# Patient Record
Sex: Female | Born: 1945
Health system: Southern US, Community
[De-identification: ages and names within clinical notes are randomized; demographics above are authoritative.]

## PROBLEM LIST (undated history)

## (undated) DIAGNOSIS — K219 Gastro-esophageal reflux disease without esophagitis: Secondary | ICD-10-CM

## (undated) DIAGNOSIS — Z8619 Personal history of other infectious and parasitic diseases: Secondary | ICD-10-CM

## (undated) DIAGNOSIS — E7800 Pure hypercholesterolemia, unspecified: Secondary | ICD-10-CM

## (undated) DIAGNOSIS — G43909 Migraine, unspecified, not intractable, without status migrainosus: Secondary | ICD-10-CM

## (undated) DIAGNOSIS — I10 Essential (primary) hypertension: Secondary | ICD-10-CM

## (undated) DIAGNOSIS — I700 Atherosclerosis of aorta: Secondary | ICD-10-CM

## (undated) DIAGNOSIS — Z8719 Personal history of other diseases of the digestive system: Secondary | ICD-10-CM

## (undated) HISTORY — PX: WRIST SURGERY: SHX841

## (undated) HISTORY — PX: ABDOMINAL HYSTERECTOMY: SHX81

## (undated) HISTORY — DX: Personal history of other infectious and parasitic diseases: Z86.19

## (undated) HISTORY — DX: Atherosclerosis of aorta: I70.0

## (undated) HISTORY — DX: Personal history of other diseases of the digestive system: Z87.19

## (undated) HISTORY — PX: TUBAL LIGATION: SHX77

---
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
## 2016-01-06 DIAGNOSIS — E782 Mixed hyperlipidemia: Secondary | ICD-10-CM

## 2016-01-06 DIAGNOSIS — G43909 Migraine, unspecified, not intractable, without status migrainosus: Secondary | ICD-10-CM | POA: Insufficient documentation

## 2016-01-06 DIAGNOSIS — K219 Gastro-esophageal reflux disease without esophagitis: Secondary | ICD-10-CM | POA: Insufficient documentation

## 2016-01-06 DIAGNOSIS — M810 Age-related osteoporosis without current pathological fracture: Secondary | ICD-10-CM | POA: Insufficient documentation

## 2016-01-06 DIAGNOSIS — H5213 Myopia, bilateral: Secondary | ICD-10-CM

## 2016-01-06 DIAGNOSIS — H524 Presbyopia: Secondary | ICD-10-CM

## 2016-01-06 DIAGNOSIS — Z79899 Other long term (current) drug therapy: Secondary | ICD-10-CM

## 2016-01-06 DIAGNOSIS — H2513 Age-related nuclear cataract, bilateral: Secondary | ICD-10-CM

## 2016-01-06 DIAGNOSIS — M722 Plantar fascial fibromatosis: Secondary | ICD-10-CM | POA: Insufficient documentation

## 2016-01-06 DIAGNOSIS — E1169 Type 2 diabetes mellitus with other specified complication: Secondary | ICD-10-CM | POA: Insufficient documentation

## 2016-01-06 HISTORY — DX: Myopia, bilateral: H52.13

## 2016-01-06 HISTORY — DX: Age-related osteoporosis without current pathological fracture: M81.0

## 2016-01-06 HISTORY — DX: Mixed hyperlipidemia: E78.2

## 2016-01-06 HISTORY — DX: Gastro-esophageal reflux disease without esophagitis: K21.9

## 2016-01-06 HISTORY — DX: Age-related nuclear cataract, bilateral: H25.13

## 2016-01-06 HISTORY — DX: Migraine, unspecified, not intractable, without status migrainosus: G43.909

## 2016-01-06 HISTORY — DX: Plantar fascial fibromatosis: M72.2

## 2016-01-06 HISTORY — DX: Presbyopia: H52.4

## 2016-01-06 HISTORY — DX: Other long term (current) drug therapy: Z79.899

## 2016-05-23 DIAGNOSIS — N816 Rectocele: Secondary | ICD-10-CM | POA: Insufficient documentation

## 2016-05-23 HISTORY — DX: Rectocele: N81.6

## 2017-07-23 DIAGNOSIS — E782 Mixed hyperlipidemia: Secondary | ICD-10-CM | POA: Diagnosis not present

## 2017-07-25 DIAGNOSIS — M25551 Pain in right hip: Secondary | ICD-10-CM | POA: Diagnosis not present

## 2017-07-25 DIAGNOSIS — Z23 Encounter for immunization: Secondary | ICD-10-CM | POA: Diagnosis not present

## 2017-07-25 DIAGNOSIS — Z0000 Encounter for general adult medical examination without abnormal findings: Secondary | ICD-10-CM | POA: Diagnosis not present

## 2017-07-25 DIAGNOSIS — K219 Gastro-esophageal reflux disease without esophagitis: Secondary | ICD-10-CM | POA: Diagnosis not present

## 2017-07-25 DIAGNOSIS — R8290 Unspecified abnormal findings in urine: Secondary | ICD-10-CM | POA: Diagnosis not present

## 2017-07-25 DIAGNOSIS — G43009 Migraine without aura, not intractable, without status migrainosus: Secondary | ICD-10-CM | POA: Diagnosis not present

## 2017-07-31 DIAGNOSIS — Z1212 Encounter for screening for malignant neoplasm of rectum: Secondary | ICD-10-CM | POA: Diagnosis not present

## 2017-08-01 DIAGNOSIS — M7061 Trochanteric bursitis, right hip: Secondary | ICD-10-CM | POA: Diagnosis not present

## 2017-08-08 DIAGNOSIS — K921 Melena: Secondary | ICD-10-CM | POA: Diagnosis not present

## 2017-08-27 DIAGNOSIS — Z1211 Encounter for screening for malignant neoplasm of colon: Secondary | ICD-10-CM | POA: Diagnosis not present

## 2017-08-27 DIAGNOSIS — K5730 Diverticulosis of large intestine without perforation or abscess without bleeding: Secondary | ICD-10-CM | POA: Diagnosis not present

## 2017-08-27 DIAGNOSIS — Z86010 Personal history of colonic polyps: Secondary | ICD-10-CM | POA: Diagnosis not present

## 2017-08-27 DIAGNOSIS — K648 Other hemorrhoids: Secondary | ICD-10-CM | POA: Diagnosis not present

## 2017-08-27 DIAGNOSIS — K649 Unspecified hemorrhoids: Secondary | ICD-10-CM | POA: Diagnosis not present

## 2017-09-17 DIAGNOSIS — H524 Presbyopia: Secondary | ICD-10-CM | POA: Diagnosis not present

## 2017-09-17 DIAGNOSIS — H11153 Pinguecula, bilateral: Secondary | ICD-10-CM | POA: Diagnosis not present

## 2017-09-17 DIAGNOSIS — H52203 Unspecified astigmatism, bilateral: Secondary | ICD-10-CM | POA: Diagnosis not present

## 2017-09-17 DIAGNOSIS — H2513 Age-related nuclear cataract, bilateral: Secondary | ICD-10-CM | POA: Diagnosis not present

## 2017-11-08 ENCOUNTER — Emergency Department (HOSPITAL_COMMUNITY): Payer: Medicare Other

## 2017-11-08 ENCOUNTER — Other Ambulatory Visit: Payer: Self-pay

## 2017-11-08 ENCOUNTER — Encounter (HOSPITAL_COMMUNITY): Payer: Self-pay | Admitting: *Deleted

## 2017-11-08 ENCOUNTER — Emergency Department (HOSPITAL_COMMUNITY)
Admission: EM | Admit: 2017-11-08 | Discharge: 2017-11-08 | Disposition: A | Discharge status: Home / Self Care | Payer: Medicare Other | Attending: Emergency Medicine | Admitting: Emergency Medicine

## 2017-11-08 DIAGNOSIS — H6121 Impacted cerumen, right ear: Secondary | ICD-10-CM | POA: Diagnosis not present

## 2017-11-08 DIAGNOSIS — M542 Cervicalgia: Secondary | ICD-10-CM | POA: Insufficient documentation

## 2017-11-08 DIAGNOSIS — R519 Headache, unspecified: Secondary | ICD-10-CM

## 2017-11-08 DIAGNOSIS — R42 Dizziness and giddiness: Secondary | ICD-10-CM

## 2017-11-08 DIAGNOSIS — H6982 Other specified disorders of Eustachian tube, left ear: Secondary | ICD-10-CM | POA: Diagnosis not present

## 2017-11-08 DIAGNOSIS — E876 Hypokalemia: Secondary | ICD-10-CM

## 2017-11-08 DIAGNOSIS — R51 Headache: Secondary | ICD-10-CM | POA: Diagnosis not present

## 2017-11-08 DIAGNOSIS — I10 Essential (primary) hypertension: Secondary | ICD-10-CM | POA: Diagnosis not present

## 2017-11-08 DIAGNOSIS — Z87891 Personal history of nicotine dependence: Secondary | ICD-10-CM | POA: Diagnosis not present

## 2017-11-08 HISTORY — DX: Migraine, unspecified, not intractable, without status migrainosus: G43.909

## 2017-11-08 HISTORY — DX: Pure hypercholesterolemia, unspecified: E78.00

## 2017-11-08 HISTORY — DX: Gastro-esophageal reflux disease without esophagitis: K21.9

## 2017-11-08 LAB — DIFFERENTIAL
Basophils Absolute: 0 10*3/uL (ref 0.0–0.1)
Basophils Relative: 0 %
Eosinophils Absolute: 0.1 10*3/uL (ref 0.0–0.7)
Eosinophils Relative: 1 %
LYMPHS ABS: 1.4 10*3/uL (ref 0.7–4.0)
LYMPHS PCT: 19 %
MONOS PCT: 6 %
Monocytes Absolute: 0.5 10*3/uL (ref 0.1–1.0)
NEUTROS ABS: 5.2 10*3/uL (ref 1.7–7.7)
Neutrophils Relative %: 74 %

## 2017-11-08 LAB — CBC
HEMATOCRIT: 44.4 % (ref 36.0–46.0)
Hemoglobin: 15.2 g/dL — ABNORMAL HIGH (ref 12.0–15.0)
MCH: 30.6 pg (ref 26.0–34.0)
MCHC: 34.2 g/dL (ref 30.0–36.0)
MCV: 89.3 fL (ref 78.0–100.0)
Platelets: 223 10*3/uL (ref 150–400)
RBC: 4.97 MIL/uL (ref 3.87–5.11)
RDW: 13 % (ref 11.5–15.5)
WBC: 7.2 10*3/uL (ref 4.0–10.5)

## 2017-11-08 LAB — COMPREHENSIVE METABOLIC PANEL
ALK PHOS: 47 U/L (ref 38–126)
ALT: 34 U/L (ref 14–54)
AST: 37 U/L (ref 15–41)
Albumin: 4.4 g/dL (ref 3.5–5.0)
Anion gap: 14 (ref 5–15)
BUN: 19 mg/dL (ref 6–20)
CALCIUM: 10.3 mg/dL (ref 8.9–10.3)
CO2: 21 mmol/L — ABNORMAL LOW (ref 22–32)
CREATININE: 0.91 mg/dL (ref 0.44–1.00)
Chloride: 105 mmol/L (ref 101–111)
Glucose, Bld: 101 mg/dL — ABNORMAL HIGH (ref 65–99)
Potassium: 3.3 mmol/L — ABNORMAL LOW (ref 3.5–5.1)
Sodium: 140 mmol/L (ref 135–145)
Total Bilirubin: 1.8 mg/dL — ABNORMAL HIGH (ref 0.3–1.2)
Total Protein: 6.6 g/dL (ref 6.5–8.1)

## 2017-11-08 LAB — I-STAT TROPONIN, ED
TROPONIN I, POC: 0 ng/mL (ref 0.00–0.08)
Troponin i, poc: 0.01 ng/mL (ref 0.00–0.08)

## 2017-11-08 LAB — PROTIME-INR
INR: 0.95
Prothrombin Time: 12.6 seconds (ref 11.4–15.2)

## 2017-11-08 LAB — APTT: aPTT: 27 seconds (ref 24–36)

## 2017-11-08 MED ORDER — MECLIZINE HCL 25 MG PO TABS: 25.0000 mg | ORAL_TABLET | Freq: Three times a day (TID) | ORAL | 0 refills | Status: DC | PRN

## 2017-11-08 MED ORDER — FLUTICASONE PROPIONATE 50 MCG/ACT NA SUSP: 2.0000 | Freq: Every day | NASAL | 0 refills | Status: DC

## 2017-11-08 MED ORDER — POTASSIUM CHLORIDE CRYS ER 20 MEQ PO TBCR
40.0000 meq | EXTENDED_RELEASE_TABLET | Freq: Once | ORAL | Status: AC
  Administered 2017-11-08: 40 meq via ORAL
  Filled 2017-11-08: qty 2

## 2017-11-08 MED ORDER — MECLIZINE HCL 25 MG PO TABS
25.0000 mg | ORAL_TABLET | Freq: Once | ORAL | Status: AC
  Administered 2017-11-08: 25 mg via ORAL
  Filled 2017-11-08: qty 1

## 2017-11-08 NOTE — ED Triage Notes (Signed)
Pt sent here from [facility], having dizziness x 3 days. States it feels like the room is spinning, occurs when she moves her head and when lying still. Has mild squeezing pain to top of her head and intermittent pain to her neck.

## 2017-11-08 NOTE — Discharge Instructions (Signed)
Your symptoms could be due to multiple causes, however today's work up has been reassuring. Use meclizine as directed as needed for dizziness. Stay well hydrated. Use flonase and netipot nasal saline rinse as directed for sinus congestion and to help with your ear issue. Use over the counter antihistamines such as claritin/zyrtec/allegra/etc to help with the symptoms as well. Alternate between tylenol and ibuprofen as needed for pain.   Your blood pressure was elevated today. Eat a low salt/low sodium diet. Keep a log of your blood pressure readings from every morning and evening (making sure to give yourself at least 15 minutes of rest prior to checking it) and take it to your doctor's office at your next appointment for ongoing management of your blood pressure. Stay well hydrated and get plenty of rest. Avoid caffeine and other over the counter products that would make your blood pressure go up (such as decongestants, excedrin, etc).   Follow up with your regular doctor in 5-7 days for recheck of symptoms and ongoing management of your blood pressure. Return to the ER for emergent changes or worsening symptoms.

## 2017-11-08 NOTE — ED Notes (Signed)
PT states understanding of care given, follow up care, and medication prescribed. PT ambulated from ED to car with a steady gait. 

## 2017-11-08 NOTE — ED Provider Notes (Signed)
10:37 PM BP (!) 167/101   Pulse 73   Temp 98.4 F (36.9 C) (Oral)   Resp 13   Ht 5\' 6"  (1.676 m)   Wt 102.1 kg (225 lb)   SpO2 96%   BMI 36.32 kg/m  Patient with 3 days of Dizziness, MRI pending. Expect d/c if neg. Ambulatory.  11:27 PM BP (!) 150/83 (BP Location: Right Arm)   Pulse 65   Temp 98.4 F (36.9 C) (Oral)   Resp 18   Ht 5\' 6"  (1.676 m)   Wt 102.1 kg (225 lb)   SpO2 98%   BMI 36.32 kg/m  Patient MRI negative for Stroke or other abnormality. The patient will be discharged. She is ambulatory in the ER.       Arthor CaptainHarris, Aedon Deason, PA-C 11/08/17 2328    Rolan BuccoBelfi, Melanie, MD 11/08/17 [phone]

## 2017-11-08 NOTE — ED Provider Notes (Signed)
[facility] EMERGENCY DEPARTMENT Provider Note   CSN: [id number] Arrival date & time: 11/08/17  1428     History   Chief Complaint Chief Complaint  Patient presents with  . Dizziness    HPI Victoria Barajas is a 72 y.o. female with a PMHx of HLD, GERD, migraines, and osteoporosis, who presents to the ED with complaints of dizziness x 3 days.  Patient states that she has intermittent dizzy spells whenever she moves her head, it feels as though she is spinning or falling.  The symptoms only occur with head movement, and seem to improve with being still.  She has never had anything like this before.  She states that 4 days ago, the day prior to onset of her symptoms, she had a migraine which resolved with her usual Excedrin use.  However today she states that when she was on her way to the urgent care around 8am, she started having a squeezing sensation on the crown of her head that she states is not a headache but just a pressure in the crown of her head.  She describes this as a 5/10 constant pressure-like sensation at the top of her head, nonradiating, worse with laying her head back, and with no treatments tried prior to arrival.  She states this feels different than migraines, and does not feel like a headache.  She also mentions that for the last 2 months she will have a sensation of the right neck/skull base as though she is going to get a "crick in her neck"and she will turn her neck and hear a pop in the sensation resolves.  Lastly she mentions that she has a chronic ear popping/crackling intermittently in the left ear, but states this is unchanged.  She went to the [facility] urgent care this morning and was referred here for further evaluation of her symptoms.  Of note, she states she's had high blood pressure readings recently at her PCPs visits, but hasn't been started on meds yet, however thinks she's going to be started on HTN meds at her next PCP visit, since it was brought  up recently.  She denies any changes in her meds recently, no new meds recently, takes pravachol and omeprazole.  Her PCP is Dr. Daneil Dolin in [facility].   She denies any vision changes, ear pain or drainage, tinnitus, rhinorrhea, sinus congestion, URI symptoms, cough, fevers, chills, chest pain, shortness of breath, abdominal pain, nausea, vomiting, diarrhea, constipation, melena, hematochezia, dysuria, hematuria, myalgias, arthralgias, neck pain, numbness, tingling, focal weakness, syncope, or any other complaints at this time.   The history is provided by the patient and medical records. No language interpreter was used.  Dizziness  Quality:  Vertigo Severity:  Mild Onset quality:  Gradual Duration:  3 days Timing:  Intermittent Progression:  Waxing and waning Chronicity:  New Context: head movement   Relieved by:  Being still Worsened by:  Turning head and movement Ineffective treatments:  None tried Associated symptoms: headaches   Associated symptoms: no blood in stool, no chest pain, no diarrhea, no nausea, no shortness of breath, no syncope, no tinnitus, no vision changes, no vomiting and no weakness   Risk factors: no multiple medications and no new medications     Past Medical History:  Diagnosis Date  . Acid reflux   . High cholesterol   . Migraine     There are no active problems to display for this patient.   History reviewed. No pertinent  surgical history.  OB History    No data available       Home Medications    Prior to Admission medications   Not on File    Family History History reviewed. No pertinent family history.  Social History Social History   Tobacco Use  . Smoking status: Former Smoker  Substance Use Topics  . Alcohol use: No    Frequency: Never  . Drug use: No     Allergies   Patient has no known allergies.   Review of Systems Review of Systems  Constitutional: Negative for chills and fever.  HENT: Negative for ear  discharge, ear pain, rhinorrhea, sore throat and tinnitus.   Eyes: Negative for visual disturbance.  Respiratory: Negative for cough and shortness of breath.   Cardiovascular: Negative for chest pain and syncope.  Gastrointestinal: Negative for abdominal pain, blood in stool, constipation, diarrhea, nausea and vomiting.  Genitourinary: Negative for dysuria and hematuria.  Musculoskeletal: Negative for arthralgias, myalgias and neck pain.  Skin: Negative for color change.  Allergic/Immunologic: Negative for immunocompromised state.  Neurological: Positive for dizziness and headaches. Negative for syncope, weakness and numbness.  Psychiatric/Behavioral: Negative for confusion.   All other systems reviewed and are negative for acute change except as noted in the HPI.    Physical Exam Updated Vital Signs BP (!) 128/114   Pulse 87   Temp 98.4 F (36.9 C) (Oral)   Resp 17   Ht 5\' 6"  (1.676 m)   Wt 102.1 kg (225 lb)   SpO2 99%   BMI 36.32 kg/m   Physical Exam  Constitutional: She is oriented to person, place, and time. Vital signs are normal. She appears well-developed and well-nourished.  Non-toxic appearance. No distress.  Afebrile, nontoxic, NAD, BP 173/89 during evaluation  HENT:  Head: Normocephalic and atraumatic.  Right Ear: Hearing and external ear normal. A foreign body (cerumen impaction) is present.  Left Ear: Hearing, external ear and ear canal normal. Tympanic membrane is not injected, not erythematous and not bulging. A middle ear effusion (serous) is present.  Nose: Nose normal.  Mouth/Throat: Uvula is midline, oropharynx is clear and moist and mucous membranes are normal. No trismus in the jaw. No uvula swelling.  R ear occluded with cerumen, unable to visualize TM. L ear with clear canal, mild serous effusion but no TM erythema or bulging. Nose clear. Oropharynx clear and moist, without uvular swelling or deviation, no trismus or drooling, no tonsillar swelling or  erythema, no exudates.    Eyes: Conjunctivae and EOM are normal. Pupils are equal, round, and reactive to light. Right eye exhibits no discharge. Left eye exhibits no discharge.  PERRL, EOMI, no nystagmus  Neck: Normal range of motion. Neck supple. No spinous process tenderness and no muscular tenderness present. No neck rigidity. Normal range of motion present.  FROM intact without spinous process TTP, no bony stepoffs or deformities, no paraspinous muscle TTP or muscle spasms. No rigidity or meningeal signs. No bruising or swelling.   Cardiovascular: Normal rate, regular rhythm, normal heart sounds and intact distal pulses. Exam reveals no gallop and no friction rub.  No murmur heard. Pulmonary/Chest: Effort normal and breath sounds normal. No respiratory distress. She has no decreased breath sounds. She has no wheezes. She has no rhonchi. She has no rales.  Abdominal: Soft. Normal appearance and bowel sounds are normal. She exhibits no distension. There is no tenderness. There is no rigidity, no rebound, no guarding, no CVA tenderness, no tenderness at  McBurney's point and negative Murphy's sign.  Musculoskeletal: Normal range of motion.  MAE x4 Strength and sensation grossly intact in all extremities Distal pulses intact Gait steady  Neurological: She is alert and oriented to person, place, and time. She has normal strength. No cranial nerve deficit or sensory deficit. Coordination and gait normal. GCS eye subscore is 4. GCS verbal subscore is 5. GCS motor subscore is 6.  CN 2-12 grossly intact A&O x4 GCS 15 Sensation and strength intact Gait nonataxic, mild difficulty with tandem walking but able to complete tandem steps without assistance, moving somewhat slowly Coordination with finger-to-nose WNL Neg pronator drift   Skin: Skin is warm, dry and intact. No rash noted.  Psychiatric: She has a normal mood and affect.  Nursing note and vitals reviewed.    ED Treatments / Results    Labs (all labs ordered are listed, but only abnormal results are displayed) Labs Reviewed  CBC - Abnormal; Notable for the following components:      Result Value   Hemoglobin 15.2 (*)    All other components within normal limits  COMPREHENSIVE METABOLIC PANEL - Abnormal; Notable for the following components:   Potassium 3.3 (*)    CO2 21 (*)    Glucose, Bld 101 (*)    Total Bilirubin 1.8 (*)    All other components within normal limits  PROTIME-INR  APTT  DIFFERENTIAL  I-STAT TROPONIN, ED  I-STAT TROPONIN, ED    EKG  EKG Interpretation  Date/Time:  Friday November 08 2017 14:44:49 EST Ventricular Rate:  88 PR Interval:  146 QRS Duration: 150 QT Interval:  404 QTC Calculation: 488 R Axis:   -22 Text Interpretation:  Sinus rhythm with occasional Premature ventricular complexes and Possible Premature atrial complexes with Abberant conduction Left bundle branch block Abnormal ECG Confirmed by Blane Ohara [phone]) on 11/08/2017 7:20:23 PM       Radiology Ct Head Wo Contrast  Result Date: 11/08/2017 CLINICAL DATA:  Dizziness and syncopal episodes for the past week. EXAM: CT HEAD WITHOUT CONTRAST TECHNIQUE: Contiguous axial images were obtained from the base of the skull through the vertex without intravenous contrast. COMPARISON:  None. FINDINGS: Brain: Normal appearing cerebral hemispheres and posterior fossa structures. Normal size and position of the ventricles. No intracranial hemorrhage, mass lesion or CT evidence of acute infarction. Vascular: No hyperdense vessel or unexpected calcification. Skull: Normal. Negative for fracture or focal lesion. Sinuses/Orbits: Mild right frontal and minimal right ethmoid sinus mucosal thickening. Unremarkable orbits. Other: None. IMPRESSION: 1. No intracranial abnormality. 2. Mild chronic sinusitis. Electronically Signed   By: Beckie Salts M.D.   On: 11/08/2017 15:11    Procedures Procedures (including critical care time)  Medications  Ordered in ED Medications  potassium chloride SA (K-DUR,KLOR-CON) CR tablet 40 mEq (40 mEq Oral Given 11/08/17 2034)  meclizine (ANTIVERT) tablet 25 mg (25 mg Oral Given 11/08/17 2031)     Initial Impression / Assessment and Plan / ED Course  I have reviewed the triage vital signs and the nursing notes.  Pertinent labs & imaging results that were available during my care of the patient were reviewed by me and considered in my medical decision making (see chart for details).     72 y.o. female here with 3 days of vertiginous dizziness with head movements, and today started having a mild pressure-like headache at the top of her head which is different than her migraines. She went to [facility] who sent her here for further evaluation. On  exam, no focal neuro deficits, has some mild difficulty with tandem walking but is still able to complete this slowly. R ear occluded with cerumen, L ear with mild serous effusion but otherwise TM unremarkable and without evidence of infection. BP 173/89 during eval, pt states she's not on BP meds yet but was told her BP was high recently and she thinks her PCP will be starting meds at her next visit. Work up thus far reveals: EKG with PACs and PVCs and LBBB. Trop neg x2. INR WNL. APTT WNL. CBC w/diff WNL. CMP with mildly low K 3.3, and mildly elevated Tbili 1.8, but otherwise fairly unremarkable. CT head negative for acute findings, mild chronic sinusitis seen. Will proceed with MRI brain to eval for stroke or other central cause of her vertigo, and give meclizine and Kdur. Pt declines wanting anything for her headache as she states it's not really a pain/headache. Will allow permissive HTN for now, until we get the MRI. Will reassess shortly. Discussed case with my attending Dr. Jodi MourningZavitz who agrees with plan.    10:37 PM MRI brain pending. Pt states meclizine helping. Patient care to be resumed by Huntley DecAbi Harris, PA-C at shift change sign-out. Patient history has been discussed  with midlevel resuming care. Plan is: if abnormal brain MRI, consult neuro and consider admission if warranted; if MRI brain normal, then symptoms most consistent with BPPV, could be eustachian tube dysfunction causing vertigo given the L TM serous effusion finding. Will start on meclizine and flonase, advised other OTC remedies for symptomatic relief, netipot sinus rinses discussed, and f/up with PCP in 5-7 days for recheck. For her BP, advised DASH diet and keeping log of pressures at home to take to her next PCP visit; will hold off on starting BP meds today since I don't want to muddy the waters with the dizziness issue and since her BP is improving during her stay here. Discussed avoidance of OTC things that would cause the BP to go up. Advised f/up with PCP in 5-7 days for ongoing management of this finding as well.  Please see oncoming provider's notes for further documentation of pending results and dispo/care. Pt stable at sign-out and updated on transfer of care.     Final Clinical Impressions(s) / ED Diagnoses   Final diagnoses:  Vertigo  Dizziness  Essential hypertension  Nonintractable headache, unspecified chronicity pattern, unspecified headache type  Eustachian tube dysfunction, left  Hypokalemia    ED Discharge Orders        Ordered    fluticasone (FLONASE) 50 MCG/ACT nasal spray  Daily     11/08/17 2234    meclizine (ANTIVERT) 25 MG tablet  3 times daily PRN     11/08/17 [address]34       Jerianne Anselmo, GuernevilleMercedes, [place] JerseyPA-C 11/08/17 2237    Blane OharaZavitz, Joshua, MD 11/08/17 2257

## 2017-11-08 NOTE — ED Notes (Signed)
EMT Brianna called lab and they stated they cannot locate the labs that she sent down.  Will re-draw.

## 2017-11-13 DIAGNOSIS — R7301 Impaired fasting glucose: Secondary | ICD-10-CM | POA: Diagnosis not present

## 2017-11-13 DIAGNOSIS — E782 Mixed hyperlipidemia: Secondary | ICD-10-CM | POA: Diagnosis not present

## 2017-11-25 DIAGNOSIS — M810 Age-related osteoporosis without current pathological fracture: Secondary | ICD-10-CM | POA: Diagnosis not present

## 2017-11-25 DIAGNOSIS — I10 Essential (primary) hypertension: Secondary | ICD-10-CM | POA: Insufficient documentation

## 2017-11-25 DIAGNOSIS — K219 Gastro-esophageal reflux disease without esophagitis: Secondary | ICD-10-CM | POA: Diagnosis not present

## 2017-11-25 DIAGNOSIS — R7301 Impaired fasting glucose: Secondary | ICD-10-CM | POA: Diagnosis not present

## 2017-11-25 DIAGNOSIS — E782 Mixed hyperlipidemia: Secondary | ICD-10-CM | POA: Diagnosis not present

## 2017-11-25 HISTORY — DX: Essential (primary) hypertension: I10

## 2018-03-20 DIAGNOSIS — J0140 Acute pansinusitis, unspecified: Secondary | ICD-10-CM | POA: Diagnosis not present

## 2018-03-20 DIAGNOSIS — J209 Acute bronchitis, unspecified: Secondary | ICD-10-CM | POA: Diagnosis not present

## 2018-04-07 DIAGNOSIS — N3000 Acute cystitis without hematuria: Secondary | ICD-10-CM | POA: Diagnosis not present

## 2018-04-07 DIAGNOSIS — Z79899 Other long term (current) drug therapy: Secondary | ICD-10-CM | POA: Diagnosis not present

## 2018-04-07 DIAGNOSIS — Z7982 Long term (current) use of aspirin: Secondary | ICD-10-CM | POA: Diagnosis not present

## 2018-04-07 DIAGNOSIS — M5416 Radiculopathy, lumbar region: Secondary | ICD-10-CM | POA: Diagnosis not present

## 2018-04-07 DIAGNOSIS — N281 Cyst of kidney, acquired: Secondary | ICD-10-CM | POA: Diagnosis not present

## 2018-04-07 DIAGNOSIS — B9689 Other specified bacterial agents as the cause of diseases classified elsewhere: Secondary | ICD-10-CM | POA: Diagnosis not present

## 2018-04-11 DIAGNOSIS — R21 Rash and other nonspecific skin eruption: Secondary | ICD-10-CM | POA: Diagnosis not present

## 2018-04-11 DIAGNOSIS — R1032 Left lower quadrant pain: Secondary | ICD-10-CM | POA: Diagnosis not present

## 2018-06-26 DIAGNOSIS — N390 Urinary tract infection, site not specified: Secondary | ICD-10-CM | POA: Diagnosis not present

## 2018-07-08 DIAGNOSIS — N390 Urinary tract infection, site not specified: Secondary | ICD-10-CM | POA: Diagnosis not present

## 2018-07-22 DIAGNOSIS — Z23 Encounter for immunization: Secondary | ICD-10-CM | POA: Diagnosis not present

## 2018-08-05 DIAGNOSIS — M4716 Other spondylosis with myelopathy, lumbar region: Secondary | ICD-10-CM | POA: Diagnosis not present

## 2018-08-05 DIAGNOSIS — I10 Essential (primary) hypertension: Secondary | ICD-10-CM | POA: Diagnosis not present

## 2018-08-05 DIAGNOSIS — E782 Mixed hyperlipidemia: Secondary | ICD-10-CM | POA: Diagnosis not present

## 2018-08-05 DIAGNOSIS — K210 Gastro-esophageal reflux disease with esophagitis: Secondary | ICD-10-CM | POA: Diagnosis not present

## 2018-08-07 ENCOUNTER — Telehealth: Payer: Self-pay

## 2018-08-07 NOTE — Telephone Encounter (Signed)
Sent referral to scheduling and filed notes 

## 2018-08-12 DIAGNOSIS — M545 Low back pain: Secondary | ICD-10-CM | POA: Diagnosis not present

## 2018-08-19 ENCOUNTER — Ambulatory Visit: Payer: Medicare Other | Admitting: Cardiology

## 2018-08-19 ENCOUNTER — Encounter: Payer: Self-pay | Admitting: Cardiology

## 2018-08-19 ENCOUNTER — Telehealth: Payer: Self-pay

## 2018-08-19 VITALS — BP 160/82 | HR 74 | Ht 66.0 in | Wt 217.0 lb

## 2018-08-19 DIAGNOSIS — R0789 Other chest pain: Secondary | ICD-10-CM

## 2018-08-19 DIAGNOSIS — I10 Essential (primary) hypertension: Secondary | ICD-10-CM

## 2018-08-19 DIAGNOSIS — E782 Mixed hyperlipidemia: Secondary | ICD-10-CM | POA: Diagnosis not present

## 2018-08-19 DIAGNOSIS — R9431 Abnormal electrocardiogram [ECG] [EKG]: Secondary | ICD-10-CM | POA: Diagnosis not present

## 2018-08-19 MED ORDER — NITROGLYCERIN 0.4 MG SL SUBL: 0.4000 mg | SUBLINGUAL_TABLET | SUBLINGUAL | 11 refills | Status: DC | PRN

## 2018-08-19 NOTE — Progress Notes (Signed)
Cardiology Office Note:    Date:  08/19/2018   ID:  Victoria Barajas, DOB 29-Apr-1946, MRN [id number]  PCP:  Abigail Miyamoto, MD  Cardiologist:  Garwin Brothers, MD   Referring MD: Abigail Miyamoto,*    ASSESSMENT:    1. Chest tightness   2. Mixed hyperlipidemia   3. Essential hypertension   4. Abnormal EKG    PLAN:    In order of problems listed above:  1. Primary prevention stressed with the patient.  Importance of compliance with diet and medication stressed and she vocalized understanding.  Her blood pressure is stable.  Diet was discussed for dyslipidemia.  She has an element of whitecoat hypertension.  She tells me that her blood pressures at home are fine. 2. In view of the above she will have an echocardiogram done to assess murmur heard on auscultation Lexiscan sestamibi to understand her chest discomfort symptoms. 3. Sublingual nitroglycerin prescription was sent, its protocol and 911 protocol explained and the patient vocalized understanding questions were answered to the patient's satisfaction. 4. Patient will be seen in follow-up appointment in 6 months or earlier if the patient has any concerns      Medication Adjustments/Labs and Tests Ordered: Current medicines are reviewed at length with the patient today.  Concerns regarding medicines are outlined above.  No orders of the defined types were placed in this encounter.  No orders of the defined types were placed in this encounter.    History of Present Illness:    Victoria Barajas is a 72 y.o. female who is being seen today for the evaluation of chest tightness at the request of Abigail Miyamoto,*.  Patient is a pleasant 72 year old female.  She has past medical history of essential hypertension and dyslipidemia.  She mentions to me that she has an element of whitecoat hypertension.  She underwent evaluation with the primary care physician and her EKG was abnormal and she was sent here.  I do not  have those records at this time.  Patient leads a sedentary lifestyle because of orthopedic issues involving her back and some surgery that she has had a few months ago.  She mentions to me that she has substernal chest discomfort at times with no radiation to the neck or to the arms.  It happens when she is under some form of stress.  At the time of my evaluation, the patient is alert awake oriented and in no distress.  Past Medical History:  Diagnosis Date  . Acid reflux   . High cholesterol   . Migraine     Past Surgical History:  Procedure Laterality Date  . ABDOMINAL HYSTERECTOMY    . TUBAL LIGATION    . WRIST SURGERY      Current Medications: Current Meds  Medication Sig  . aspirin-acetaminophen-caffeine (EXCEDRIN MIGRAINE) 250-250-65 MG tablet Take 1 tablet by mouth every 6 (six) hours as needed.  Marland Kitchen atorvastatin (LIPITOR) 40 MG tablet Take 1 tablet by mouth daily.  Marland Kitchen denosumab (PROLIA) 60 MG/ML SOLN injection Inject 60 mg into the skin once.  Marland Kitchen lisinopril (PRINIVIL,ZESTRIL) 20 MG tablet Take 1 tablet by mouth daily.  . naproxen sodium (ALEVE) 220 MG tablet Take 220 mg by mouth daily as needed.   Marland Kitchen omeprazole (PRILOSEC) 40 MG capsule Take 40 mg by mouth at bedtime.  . polyethylene glycol (MIRALAX / GLYCOLAX) packet Take 17 g by mouth daily as needed.  . predniSONE (DELTASONE) 10 MG tablet prednisone 10 mg tablet  take 1 tab three times a day for 2 days, 1 tab twice a day for 5 days, 1 tab daily till finished     Allergies:   Tape   Social History   Socioeconomic History  . Marital status: Married    Spouse name: Not on file  . Number of children: Not on file  . Years of education: Not on file  . Highest education level: Not on file  Occupational History  . Not on file  Social Needs  . Financial resource strain: Not on file  . Food insecurity:    Worry: Not on file    Inability: Not on file  . Transportation needs:    Medical: Not on file    Non-medical: Not on  file  Tobacco Use  . Smoking status: Former Games developermoker  . Smokeless tobacco: Never Used  Substance and Sexual Activity  . Alcohol use: No    Frequency: Never  . Drug use: No  . Sexual activity: Not on file  Lifestyle  . Physical activity:    Days per week: Not on file    Minutes per session: Not on file  . Stress: Not on file  Relationships  . Social connections:    Talks on phone: Not on file    Gets together: Not on file    Attends religious service: Not on file    Active member of club or organization: Not on file    Attends meetings of clubs or organizations: Not on file    Relationship status: Not on file  Other Topics Concern  . Not on file  Social History Narrative  . Not on file     Family History: The patient's family history includes Alzheimer's disease in her brother; Diabetes in her brother, father, and sister; Heart Problems in her mother; Heart attack in her brother; Kidney disease in her sister.  ROS:   Please see the history of present illness.    All other systems reviewed and are negative.  EKGs/Labs/Other Studies Reviewed:    The following studies were reviewed today: I discussed my findings with the patient at extensive length.  EKG reveals sinus rhythm and left bundle branch block.   Recent Labs: 11/08/2017: ALT 34; BUN 19; Creatinine, Ser 0.91; Hemoglobin 15.2; Platelets 223; Potassium 3.3; Sodium 140  Recent Lipid Panel No results found for: CHOL, TRIG, HDL, CHOLHDL, VLDL, LDLCALC, LDLDIRECT  Physical Exam:    VS:  BP (!) 160/82 (BP Location: Right Arm, Patient Position: Sitting, Cuff Size: Large)   Pulse 74   Ht 5\' 6"  (1.676 m)   Wt 217 lb (98.4 kg)   SpO2 99%   BMI 35.02 kg/m     Wt Readings from Last 3 Encounters:  08/19/18 217 lb (98.4 kg)  11/08/17 225 lb (102.1 kg)     GEN: Patient is in no acute distress HEENT: Normal NECK: No JVD; No carotid bruits LYMPHATICS: No lymphadenopathy CARDIAC: S1 S2 regular, 2/6 systolic murmur at  the apex. RESPIRATORY:  Clear to auscultation without rales, wheezing or rhonchi  ABDOMEN: Soft, non-tender, non-distended MUSCULOSKELETAL:  No edema; No deformity  SKIN: Warm and dry NEUROLOGIC:  Alert and oriented x 3 PSYCHIATRIC:  Normal affect    Signed, Garwin Brothersajan R , MD  08/19/2018 10:18 AM    [facility]

## 2018-08-19 NOTE — Progress Notes (Signed)
ekg 

## 2018-08-19 NOTE — Addendum Note (Signed)
Addended by: Craige CottaANDERSON, ASHLEY S on: 08/19/2018 10:30 AM   Modules accepted: Orders

## 2018-08-19 NOTE — Patient Instructions (Signed)
Medication Instructions:  Your physician has recommended you make the following change in your medication:   START Nitroglycerin 0.4 mg sublingual (under your tongue) as needed for chest pain. If experiencing chest pain, stop what you are doing and sit down. Take 1 nitroglycerin and wait 5 minutes. If chest pain continues, take another nitroglycerin and wait 5 minutes. If chest pain does not subside, take 1 more nitroglycerin and dial 911. You make take a total of 3 nitroglycerin in a 15 minute time frame.  If you need a refill on your cardiac medications before your next appointment, please call your pharmacy.   Lab work: None  If you have labs (blood work) drawn today and your tests are completely normal, you will receive your results only by: Marland Kitchen. MyChart Message (if you have MyChart) OR . A paper copy in the mail If you have any lab test that is abnormal or we need to change your treatment, we will call you to review the results.  Testing/Procedures: Your physician has requested that you have an echocardiogram. Echocardiography is a painless test that uses sound waves to create images of your heart. It provides your doctor with information about the size and shape of your heart and how well your heart's chambers and valves are working. This procedure takes approximately one hour. There are no restrictions for this procedure.  Your physician has requested that you have a lexiscan myoview. For further information please visit [email or web address]. Please follow instruction sheet, as given.   Follow-Up: At [facility], you and your health needs are our priority.  As part of our continuing mission to provide you with exceptional heart care, we have created designated Provider Care Teams.  These Care Teams include your primary Cardiologist (physician) and Advanced Practice Providers (APPs -  Physician Assistants and Nurse Practitioners) who all work together to provide you with the care you need,  when you need it.  You will need a follow up appointment in 6 months.  Please call our office 2 months in advance to schedule this appointment.  You may see another member of our [facility] Provider Team in [place]: Gypsy Balsamobert Krasowski, MD . Norman HerrlichBrian Munley, MD  Any Other Special Instructions Will Be Listed Below (If Applicable).

## 2018-08-19 NOTE — Telephone Encounter (Signed)
Called patient and informed them of lab results

## 2018-08-21 ENCOUNTER — Encounter: Payer: Self-pay | Admitting: Legal Medicine

## 2018-08-22 DIAGNOSIS — M545 Low back pain: Secondary | ICD-10-CM | POA: Diagnosis not present

## 2018-08-25 ENCOUNTER — Ambulatory Visit (HOSPITAL_BASED_OUTPATIENT_CLINIC_OR_DEPARTMENT_OTHER)
Admission: RE | Admit: 2018-08-25 | Discharge: 2018-08-25 | Disposition: A | Discharge status: Home / Self Care | Payer: Medicare Other | Source: Ambulatory Visit | Attending: Cardiology | Admitting: Cardiology

## 2018-08-25 DIAGNOSIS — R0789 Other chest pain: Secondary | ICD-10-CM | POA: Diagnosis not present

## 2018-08-25 NOTE — Progress Notes (Signed)
  Echocardiogram 2D Echocardiogram has been performed.  Victoria Barajas T [name] 08/25/2018, 2:47 PM

## 2018-08-26 ENCOUNTER — Telehealth (HOSPITAL_COMMUNITY): Payer: Self-pay

## 2018-08-26 DIAGNOSIS — M545 Low back pain: Secondary | ICD-10-CM | POA: Diagnosis not present

## 2018-08-26 NOTE — Telephone Encounter (Signed)
Detailed instructions left on the AM. Pt asked to call back with any questions. S.Shanikia Kernodle EMTP

## 2018-08-28 ENCOUNTER — Ambulatory Visit (HOSPITAL_COMMUNITY): Payer: Medicare Other | Attending: Cardiology

## 2018-08-28 ENCOUNTER — Encounter: Payer: Medicare Other | Admitting: *Deleted

## 2018-08-28 VITALS — Ht 66.0 in | Wt 217.0 lb

## 2018-08-28 DIAGNOSIS — R9431 Abnormal electrocardiogram [ECG] [EKG]: Secondary | ICD-10-CM | POA: Diagnosis not present

## 2018-08-28 DIAGNOSIS — Z006 Encounter for examination for normal comparison and control in clinical research program: Secondary | ICD-10-CM

## 2018-08-28 DIAGNOSIS — R0789 Other chest pain: Secondary | ICD-10-CM

## 2018-08-28 LAB — MYOCARDIAL PERFUSION IMAGING
CHL CUP NUCLEAR SDS: 2
CHL CUP NUCLEAR SRS: 0
CHL CUP NUCLEAR SSS: 2
CHL CUP RESTING HR STRESS: 74 {beats}/min
CSEPPHR: 102 {beats}/min
LV dias vol: 70 mL (ref 46–106)
LV sys vol: 32 mL
NUC STRESS TID: 0.91

## 2018-08-28 MED ORDER — TECHNETIUM TC 99M TETROFOSMIN IV KIT
32.5000 | PACK | Freq: Once | INTRAVENOUS | Status: AC | PRN
  Administered 2018-08-28: 32.5 via INTRAVENOUS
  Filled 2018-08-28: qty 33

## 2018-08-28 MED ORDER — REGADENOSON 0.4 MG/5ML IV SOLN
0.4000 mg | Freq: Once | INTRAVENOUS | Status: AC
  Administered 2018-08-28: 0.4 mg via INTRAVENOUS

## 2018-08-28 MED ORDER — TECHNETIUM TC 99M TETROFOSMIN IV KIT
10.1000 | PACK | Freq: Once | INTRAVENOUS | Status: AC | PRN
  Administered 2018-08-28: 10.1 via INTRAVENOUS
  Filled 2018-08-28: qty 11

## 2018-08-28 NOTE — Research (Signed)
Victoria Barajas met criteria for the CADFEM trial. The trial was explained to her including the risk /benefits. She had the opportunity to read the consent and ask questions. The consent was signed and a copy was given to Victoria Barajas. No study related procedures were done prior to the consent being signed at 7:05.

## 2018-09-22 DIAGNOSIS — H11153 Pinguecula, bilateral: Secondary | ICD-10-CM | POA: Diagnosis not present

## 2018-09-22 DIAGNOSIS — H5211 Myopia, right eye: Secondary | ICD-10-CM | POA: Diagnosis not present

## 2018-09-22 DIAGNOSIS — H2513 Age-related nuclear cataract, bilateral: Secondary | ICD-10-CM | POA: Diagnosis not present

## 2018-09-22 DIAGNOSIS — H524 Presbyopia: Secondary | ICD-10-CM | POA: Diagnosis not present

## 2018-11-07 DIAGNOSIS — E782 Mixed hyperlipidemia: Secondary | ICD-10-CM | POA: Diagnosis not present

## 2018-11-07 DIAGNOSIS — I10 Essential (primary) hypertension: Secondary | ICD-10-CM | POA: Diagnosis not present

## 2018-11-07 DIAGNOSIS — I447 Left bundle-branch block, unspecified: Secondary | ICD-10-CM | POA: Diagnosis not present

## 2018-11-07 DIAGNOSIS — K210 Gastro-esophageal reflux disease with esophagitis: Secondary | ICD-10-CM | POA: Diagnosis not present

## 2019-01-21 ENCOUNTER — Telehealth: Payer: Self-pay

## 2019-01-21 NOTE — Telephone Encounter (Signed)
RN called patient to schedule 6 mo f/u. Consent request sent via mychart. Patient instructed to have home BP cuff available, weigh herself and all medication out for reconciliation/refills. Patient scheduled for virtual visit and had no further questions.

## 2019-01-22 ENCOUNTER — Encounter: Payer: Self-pay | Admitting: Cardiology

## 2019-01-22 ENCOUNTER — Telehealth: Payer: Self-pay

## 2019-01-22 ENCOUNTER — Other Ambulatory Visit: Payer: Self-pay

## 2019-01-22 ENCOUNTER — Telehealth: Payer: Self-pay | Admitting: Cardiology

## 2019-01-22 ENCOUNTER — Telehealth (INDEPENDENT_AMBULATORY_CARE_PROVIDER_SITE_OTHER): Payer: Medicare Other | Admitting: Cardiology

## 2019-01-22 VITALS — BP 151/97 | HR 71 | Ht 66.0 in | Wt 218.0 lb

## 2019-01-22 DIAGNOSIS — E782 Mixed hyperlipidemia: Secondary | ICD-10-CM

## 2019-01-22 DIAGNOSIS — I10 Essential (primary) hypertension: Secondary | ICD-10-CM

## 2019-01-22 DIAGNOSIS — R079 Chest pain, unspecified: Secondary | ICD-10-CM

## 2019-01-22 MED ORDER — NITROGLYCERIN 0.4 MG SL SUBL: 0.4000 mg | SUBLINGUAL_TABLET | SUBLINGUAL | 11 refills | Status: DC | PRN

## 2019-01-22 MED ORDER — ISOSORBIDE MONONITRATE ER 60 MG PO TB24: 60.0000 mg | ORAL_TABLET | Freq: Every day | ORAL | 1 refills | Status: DC

## 2019-01-22 NOTE — Telephone Encounter (Signed)
Called patient to get consent cause not yet signed in My Chart for the 11 visit on 4/16 with RRR

## 2019-01-22 NOTE — Patient Instructions (Addendum)
Medication Instructions:  Your physician has recommended you make the following change in your medication:  START: Isosorbide 60 mg (1 tablet) daily START: Nitroglycerine 0.4mg  (1 tablet ) under your tongue every 5 minutes x 3 for chest pain.    If you need a refill on your cardiac medications before your next appointment, please call your pharmacy.   Lab work: None  If you have labs (blood work) drawn today and your tests are completely normal, you will receive your results only by: Marland Kitchen MyChart Message (if you have MyChart) OR . A paper copy in the mail If you have any lab test that is abnormal or we need to change your treatment, we will call you to review the results.  Testing/Procedures: Your physician has requested that you regularly monitor and record your blood pressure readings at home. Please use the same machine at the same time of day to check your readings and record them to bring to your follow-up visit.    Follow-Up: At [facility], you and your health needs are our priority.  As part of our continuing mission to provide you with exceptional heart care, we have created designated Provider Care Teams.  These Care Teams include your primary Cardiologist (physician) and Advanced Practice Providers (APPs -  Physician Assistants and Nurse Practitioners) who all work together to provide you with the care you need, when you need it. You will need a follow up appointment in 2 weeks. Any Other Special Instructions Will Be Listed Below (If Applicable).     Blood Pressure Record Sheet To take your blood pressure, you will need a blood pressure machine. You can buy a blood pressure machine (blood pressure monitor) at your clinic, drug store, or online. When choosing one, consider:  An automatic monitor that has an arm cuff.  A cuff that wraps snugly around your upper arm. You should be able to fit only one finger between your arm and the cuff.  A device that stores blood  pressure reading results.  Do not choose a monitor that measures your blood pressure from your wrist or finger. Follow your health care provider's instructions for how to take your blood pressure. To use this form:  Get one reading in the morning (a.m.) before you take any medicines.  Get one reading in the evening (p.m.) before supper.  Take at least 2 readings with each blood pressure check. This makes sure the results are correct. Wait 1-2 minutes between measurements.  Write down the results in the spaces on this form.  Repeat this once a week, or as told by your health care provider.  Make a follow-up appointment with your health care provider to discuss the results. Blood pressure log Date: _______________________  a.m. _____________________(1st reading) _____________________(2nd reading)  p.m. _____________________(1st reading) _____________________(2nd reading) Date: _______________________  a.m. _____________________(1st reading) _____________________(2nd reading)  p.m. _____________________(1st reading) _____________________(2nd reading) Date: _______________________  a.m. _____________________(1st reading) _____________________(2nd reading)  p.m. _____________________(1st reading) _____________________(2nd reading) Date: _______________________  a.m. _____________________(1st reading) _____________________(2nd reading)  p.m. _____________________(1st reading) _____________________(2nd reading) Date: _______________________  a.m. _____________________(1st reading) _____________________(2nd reading)  p.m. _____________________(1st reading) _____________________(2nd reading) This information is not intended to replace advice given to you by your health care provider. Make sure you discuss any questions you have with your health care provider. Document Released: 06/23/2003 Document Revised: 09/24/2017 Document Reviewed: 09/24/2017 Elsevier Interactive Patient  Education  2019 Elsevier Inc.  Isosorbide Mononitrate extended-release tablets What is this medicine? ISOSORBIDE MONONITRATE (eye soe SOR bide mon  oh NYE trate) is a vasodilator. It relaxes blood vessels, increasing the blood and oxygen supply to your heart. This medicine is used to prevent chest pain caused by angina. It will not help to stop an episode of chest pain. This medicine may be used for other purposes; ask your health care provider or pharmacist if you have questions. COMMON BRAND NAME(S): Imdur, Isotrate ER What should I tell my health care provider before I take this medicine? They need to know if you have any of these conditions: -previous heart attack or heart failure -an unusual or allergic reaction to isosorbide mononitrate, nitrates, other medicines, foods, dyes, or preservatives -pregnant or trying to get pregnant -breast-feeding How should I use this medicine? Take this medicine by mouth with a glass of water. Follow the directions on the prescription label. Do not crush or chew. Take your medicine at regular intervals. Do not take your medicine more often than directed. Do not stop taking this medicine except on the advice of your doctor or health care professional. Talk to your pediatrician regarding the use of this medicine in children. Special care may be needed. Overdosage: If you think you have taken too much of this medicine contact a poison control center or emergency room at once. NOTE: This medicine is only for you. Do not share this medicine with others. What if I miss a dose? If you miss a dose, take it as soon as you can. If it is almost time for your next dose, take only that dose. Do not take double or extra doses. What may interact with this medicine? Do not take this medicine with any of the following medications: -medicines used to treat erectile dysfunction (ED) like avanafil, sildenafil, tadalafil, and vardenafil -riociguat This medicine may also  interact with the following medications: -medicines for high blood pressure -other medicines for angina or heart failure This list may not describe all possible interactions. Give your health care provider a list of all the medicines, herbs, non-prescription drugs, or dietary supplements you use. Also tell them if you smoke, drink alcohol, or use illegal drugs. Some items may interact with your medicine. What should I watch for while using this medicine? Check your heart rate and blood pressure regularly while you are taking this medicine. Ask your doctor or health care professional what your heart rate and blood pressure should be and when you should contact him or her. Tell your doctor or health care professional if you feel your medicine is no longer working. You may get dizzy. Do not drive, use machinery, or do anything that needs mental alertness until you know how this medicine affects you. To reduce the risk of dizzy or fainting spells, do not sit or stand up quickly, especially if you are an older patient. Alcohol can make you more dizzy, and increase flushing and rapid heartbeats. Avoid alcoholic drinks. Do not treat yourself for coughs, colds, or pain while you are taking this medicine without asking your doctor or health care professional for advice. Some ingredients may increase your blood pressure. What side effects may I notice from receiving this medicine? Side effects that you should report to your doctor or health care professional as soon as possible: -bluish discoloration of lips, fingernails, or palms of hands -irregular heartbeat, palpitations -low blood pressure -nausea, vomiting -persistent headache -unusually weak or tired Side effects that usually do not require medical attention (report to your doctor or health care professional if they continue or are bothersome): -flushing of  the face or neck -rash This list may not describe all possible side effects. Call your doctor  for medical advice about side effects. You may report side effects to FDA at 1-800-FDA-1088. Where should I keep my medicine? Keep out of the reach of children. Store between 15 and 30 degrees C (59 and 86 degrees F). Keep container tightly closed. Throw away any unused medicine after the expiration date. NOTE: This sheet is a summary. It may not cover all possible information. If you have questions about this medicine, talk to your doctor, pharmacist, or health care provider.  2019 Elsevier/Gold Standard (2013-07-24 14:48:19)  Nitroglycerin sublingual tablets What is this medicine? NITROGLYCERIN (nye troe GLI ser in) is a type of vasodilator. It relaxes blood vessels, increasing the blood and oxygen supply to your heart. This medicine is used to relieve chest pain caused by angina. It is also used to prevent chest pain before activities like climbing stairs, going outdoors in cold weather, or sexual activity. This medicine may be used for other purposes; ask your health care provider or pharmacist if you have questions. COMMON BRAND NAME(S): Nitroquick, Nitrostat, Nitrotab What should I tell my health care provider before I take this medicine? They need to know if you have any of these conditions: -anemia -head injury, recent stroke, or bleeding in the brain -liver disease -previous heart attack -an unusual or allergic reaction to nitroglycerin, other medicines, foods, dyes, or preservatives -pregnant or trying to get pregnant -breast-feeding How should I use this medicine? Take this medicine by mouth as needed. At the first sign of an angina attack (chest pain or tightness) place one tablet under your tongue. You can also take this medicine 5 to 10 minutes before an event likely to produce chest pain. Follow the directions on the prescription label. Let the tablet dissolve under the tongue. Do not swallow whole. Replace the dose if you accidentally swallow it. It will help if your mouth is  not dry. Saliva around the tablet will help it to dissolve more quickly. Do not eat or drink, smoke or chew tobacco while a tablet is dissolving. If you are not better within 5 minutes after taking ONE dose of nitroglycerin, call 9-1-1 immediately to seek emergency medical care. Do not take more than 3 nitroglycerin tablets over 15 minutes. If you take this medicine often to relieve symptoms of angina, your doctor or health care professional may provide you with different instructions to manage your symptoms. If symptoms do not go away after following these instructions, it is important to call 9-1-1 immediately. Do not take more than 3 nitroglycerin tablets over 15 minutes. Talk to your pediatrician regarding the use of this medicine in children. Special care may be needed. Overdosage: If you think you have taken too much of this medicine contact a poison control center or emergency room at once. NOTE: This medicine is only for you. Do not share this medicine with others. What if I miss a dose? This does not apply. This medicine is only used as needed. What may interact with this medicine? Do not take this medicine with any of the following medications: -certain migraine medicines like ergotamine and dihydroergotamine (DHE) -medicines used to treat erectile dysfunction like sildenafil, tadalafil, and vardenafil -riociguat This medicine may also interact with the following medications: -alteplase -aspirin -heparin -medicines for high blood pressure -medicines for mental depression -other medicines used to treat angina -phenothiazines like chlorpromazine, mesoridazine, prochlorperazine, thioridazine This list may not describe all possible interactions.  Give your health care provider a list of all the medicines, herbs, non-prescription drugs, or dietary supplements you use. Also tell them if you smoke, drink alcohol, or use illegal drugs. Some items may interact with your medicine. What should I  watch for while using this medicine? Tell your doctor or health care professional if you feel your medicine is no longer working. Keep this medicine with you at all times. Sit or lie down when you take your medicine to prevent falling if you feel dizzy or faint after using it. Try to remain calm. This will help you to feel better faster. If you feel dizzy, take several deep breaths and lie down with your feet propped up, or bend forward with your head resting between your knees. You may get drowsy or dizzy. Do not drive, use machinery, or do anything that needs mental alertness until you know how this drug affects you. Do not stand or sit up quickly, especially if you are an older patient. This reduces the risk of dizzy or fainting spells. Alcohol can make you more drowsy and dizzy. Avoid alcoholic drinks. Do not treat yourself for coughs, colds, or pain while you are taking this medicine without asking your doctor or health care professional for advice. Some ingredients may increase your blood pressure. What side effects may I notice from receiving this medicine? Side effects that you should report to your doctor or health care professional as soon as possible: -blurred vision -dry mouth -skin rash -sweating -the feeling of extreme pressure in the head -unusually weak or tired Side effects that usually do not require medical attention (report to your doctor or health care professional if they continue or are bothersome): -flushing of the face or neck -headache -irregular heartbeat, palpitations -nausea, vomiting This list may not describe all possible side effects. Call your doctor for medical advice about side effects. You may report side effects to FDA at 1-800-FDA-1088. Where should I keep my medicine? Keep out of the reach of children. Store at room temperature between 20 and 25 degrees C (68 and 77 degrees F). Store in Retail buyer. Protect from light and moisture. Keep tightly  closed. Throw away any unused medicine after the expiration date. NOTE: This sheet is a summary. It may not cover all possible information. If you have questions about this medicine, talk to your doctor, pharmacist, or health care provider.  2019 Elsevier/Gold Standard (2013-07-23 17:57:36)

## 2019-01-22 NOTE — Progress Notes (Signed)
Virtual Visit via Video Note   This visit type was conducted due to national recommendations for restrictions regarding the COVID-19 Pandemic (e.g. social distancing) in an effort to limit this patient's exposure and mitigate transmission in our community.  Due to her co-morbid illnesses, this patient is at least at moderate risk for complications without adequate follow up.  This format is felt to be most appropriate for this patient at this time.  All issues noted in this document were discussed and addressed.  A limited physical exam was performed with this format.  Please refer to the patient's chart for her consent to telehealth for [facility].   Evaluation Performed:  Follow-up visit  Date:  01/22/2019   ID:  Victoria Barajas, Victoria Barajas June 27, 1946, MRN [id number]  Patient Location: Home Provider Location: Home  PCP:  Abigail Miyamoto, MD  Cardiologist:  No primary care provider on file.  Electrophysiologist:  None   Chief Complaint:  Chest tightness  History of Present Illness:    Victoria Barajas is a 73 y.o. female with history of essential hypertension.  She denies any problems at this time except the fact that when she overexerts herself she has chest tightness and she rests and feels better.  She has never used nitroglycerin.  At the time of my evaluation, the patient is alert awake oriented and in no distress.  The patient does not have symptoms concerning for COVID-19 infection (fever, chills, cough, or new shortness of breath).    Past Medical History:  Diagnosis Date  . Acid reflux   . High cholesterol   . Migraine    Past Surgical History:  Procedure Laterality Date  . ABDOMINAL HYSTERECTOMY    . TUBAL LIGATION    . WRIST SURGERY       Current Meds  Medication Sig  . aspirin-acetaminophen-caffeine (EXCEDRIN MIGRAINE) 250-250-65 MG tablet Take 1 tablet by mouth every 6 (six) hours as needed.  Marland Kitchen atorvastatin (LIPITOR) 40 MG tablet Take 1 tablet by mouth daily.   Marland Kitchen lisinopril (PRINIVIL,ZESTRIL) 20 MG tablet Take 1 tablet by mouth daily.  . naproxen sodium (ALEVE) 220 MG tablet Take 220 mg by mouth daily as needed.   Marland Kitchen omeprazole (PRILOSEC) 40 MG capsule Take 40 mg by mouth at bedtime.  . polyethylene glycol (MIRALAX / GLYCOLAX) packet Take 17 g by mouth daily as needed.     Allergies:   Tape   Social History   Tobacco Use  . Smoking status: Former Games developer  . Smokeless tobacco: Never Used  Substance Use Topics  . Alcohol use: No    Frequency: Never  . Drug use: No     Family Hx: The patient's family history includes Alzheimer's disease in her brother; Diabetes in her brother, father, and sister; Heart Problems in her mother; Heart attack in her brother; Kidney disease in her sister.  ROS:   Please see the history of present illness.    No chest pain orthopnea or PND.  Only on exertion she has chest tightness about once in 2 weeks. All other systems reviewed and are negative.   Prior CV studies:   The following studies were reviewed today:  I discussed the findings of the stress test and echocardiogram with the patient at extensive length  Labs/Other Tests and Data Reviewed:    EKG:  No ECG reviewed.  Recent Labs: No results found for requested labs within last 8760 hours.   Recent Lipid Panel No results found for: CHOL, TRIG, HDL,  CHOLHDL, LDLCALC, LDLDIRECT  Wt Readings from Last 3 Encounters:  01/22/19 218 lb (98.9 kg)  08/28/18 217 lb (98.4 kg)  08/19/18 217 lb (98.4 kg)     Objective:    Vital Signs:  BP (!) 151/97 (BP Location: Left Arm, Patient Position: Sitting, Cuff Size: Normal)   Pulse 71   Ht 5\' 6"  (1.676 m)   Wt 218 lb (98.9 kg)   BMI 35.19 kg/m    Well nourished, well developed female in no acute distress. Patient denies any chest pain chest tightness or any problems at rest.  As mentioned above this happens occasionally at stress.  ASSESSMENT & PLAN:    1. Chest tightness: I am not sure whether the  symptoms are of angina.  However I have told the patient that I would start her on isosorbide mononitrate 60 mg daily.  She will keep a track of her blood pressures twice a day.  She will check her blood pressures only after taking her medicines at least an hour later.  She will be seen in follow-up appointment by teleconference in 2 weeks. 2. Sublingual nitroglycerin prescription was sent, its protocol and 911 protocol explained and the patient vocalized understanding questions were answered to the patient's satisfaction.  She mentions that she would like to get her medicine sent to [facility]. She knows to go to the nearest emergency room if she has any significant concerns.  COVID-19 Education: The signs and symptoms of COVID-19 were discussed with the patient and how to seek care for testing (follow up with PCP or arrange E-visit).  The importance of social distancing was discussed today.  Time:   Today, I have spent 17 minutes with the patient with telehealth technology discussing the above problems.     Medication Adjustments/Labs and Tests Ordered: Current medicines are reviewed at length with the patient today.  Concerns regarding medicines are outlined above.   Tests Ordered: No orders of the defined types were placed in this encounter.   Medication Changes: No orders of the defined types were placed in this encounter.   Disposition:  Follow up in 4 month(s)  Signed, Garwin Brothersajan R , MD  01/22/2019 11:27 AM    [facility]

## 2019-01-22 NOTE — Telephone Encounter (Signed)
Virtual Visit Pre-Appointment Phone Call  Steps For Call:  1. Confirm consent - "In the setting of the current Covid19 crisis, you are scheduled for a (phone or video) visit with your provider on (date) at (time).  Just as we do with many in-office visits, in order for you to participate in this visit, we must obtain consent.  If you'd like, I can send this to your mychart (if signed up) or email for you to review.  Otherwise, I can obtain your verbal consent now.  All virtual visits are billed to your insurance company just like a normal visit would be.  By agreeing to a virtual visit, we'd like you to understand that the technology does not allow for your provider to perform an examination, and thus may limit your provider's ability to fully assess your condition.  Finally, though the technology is pretty good, we cannot assure that it will always work on either your or our end, and in the setting of a video visit, we may have to convert it to a phone-only visit.  In either situation, we cannot ensure that we have a secure connection.  Are you willing to proceed?" STAFF: Did the patient verbally acknowledge consent to telehealth visit? Document YES/NO here: yes  2. Confirm the BEST phone number to call the day of the visit by including in appointment notes  3. Give patient instructions for WebEx/MyChart download to smartphone as below or Doximity/Doxy.me if video visit (depending on what platform provider is using)  4. Advise patient to be prepared with their blood pressure, heart rate, weight, any heart rhythm information, their current medicines, and a piece of paper and pen handy for any instructions they may receive the day of their visit  5. Inform patient they will receive a phone call 15 minutes prior to their appointment time (may be from unknown caller ID) so they should be prepared to answer  6. Confirm that appointment type is correct in Epic appointment notes (VIDEO vs PHONE)      TELEPHONE CALL NOTE  Victoria SimmeringJohnnie Gasparro has been deemed a candidate for a follow-up tele-health visit to limit community exposure during the Covid-19 pandemic. I spoke with the patient via phone to ensure availability of phone/video source, confirm preferred email & phone number, and discuss instructions and expectations.  I reminded Victoria Barajas to be prepared with any vital sign and/or heart rhythm information that could potentially be obtained via home monitoring, at the time of her visit. I reminded Victoria SimmeringJohnnie Valli to expect a phone call at the time of her visit if her visit.  Carren Rangicholas Alee Gressman, [facility]MA 01/22/2019 11:06 AM   INSTRUCTIONS FOR DOWNLOADING THE WEBEX APP TO SMARTPHONE  - If Apple, ask patient to go to [employer] Store and type in WebEx in the search bar. Download [employer]Webex Meetings, the blue/green circle. If Android, go to [employer] Play Store and type in [employer]WebEx in the search bar. The app is free but as with any other app downloads, their phone may require them to verify saved payment information or Apple/Android password.  - The patient does NOT have to create an account. - On the day of the visit, the assist will walk the patient through joining the meeting with the meeting number/password.  INSTRUCTIONS FOR DOWNLOADING THE MYCHART APP TO SMARTPHONE  - The patient must first make sure to have activated MyChart and know their login information - If Apple, go to [employer] Store and type in MyChart in the  search bar and download the app. If Android, ask patient to go to [employer] and type in [place] in the search bar and download the app. The app is free but as with any other app downloads, their phone may require them to verify saved payment information or Apple/Android password.  - The patient will need to then log into the app with their MyChart username and password, and select [facility] as their healthcare provider to link the account. When it is time for your visit, go to the MyChart app,  find appointments, and click Begin Video Visit. Be sure to Select Allow for your device to access the Microphone and Camera for your visit. You will then be connected, and your provider will be with you shortly.  **If they have any issues connecting, or need assistance please contact MyChart service desk [phone])**  **If using a computer, in order to ensure the best quality for their visit they will need to use either of the following Internet Browsers: [employer], or Google Chrome**  IF USING DOXIMITY or DOXY.ME - The patient will receive a link just prior to their visit, either by text or email (to be determined day of appointment depending on if it's doxy.me or Doximity).     FULL LENGTH CONSENT FOR TELE-HEALTH VISIT   I hereby voluntarily request, consent and authorize [facility] and its employed or contracted physicians, physician assistants, nurse practitioners or other licensed health care professionals (the Practitioner), to provide me with telemedicine health care services (the "Services") as deemed necessary by the treating Practitioner. I acknowledge and consent to receive the Services by the Practitioner via telemedicine. I understand that the telemedicine visit will involve communicating with the Practitioner through live audiovisual communication technology and the disclosure of certain medical information by electronic transmission. I acknowledge that I have been given the opportunity to request an in-person assessment or other available alternative prior to the telemedicine visit and am voluntarily participating in the telemedicine visit.  I understand that I have the right to withhold or withdraw my consent to the use of telemedicine in the course of my care at any time, without affecting my right to future care or treatment, and that the Practitioner or I may terminate the telemedicine visit at any time. I understand that I have the right to inspect all  information obtained and/or recorded in the course of the telemedicine visit and may receive copies of available information for a reasonable fee.  I understand that some of the potential risks of receiving the Services via telemedicine include:  Marland Kitchen Delay or interruption in medical evaluation due to technological equipment failure or disruption; . Information transmitted may not be sufficient (e.g. poor resolution of images) to allow for appropriate medical decision making by the Practitioner; and/or  . In rare instances, security protocols could fail, causing a breach of personal health information.  Furthermore, I acknowledge that it is my responsibility to provide information about my medical history, conditions and care that is complete and accurate to the best of my ability. I acknowledge that Practitioner's advice, recommendations, and/or decision may be based on factors not within their control, such as incomplete or inaccurate data provided by me or distortions of diagnostic images or specimens that may result from electronic transmissions. I understand that the practice of medicine is not an exact science and that Practitioner makes no warranties or guarantees regarding treatment outcomes. I acknowledge that I will receive a copy of this consent  concurrently upon execution via email to the email address I last provided but may also request a printed copy by calling the office of [facility].    I understand that my insurance will be billed for this visit.   I have read or had this consent read to me. . I understand the contents of this consent, which adequately explains the benefits and risks of the Services being provided via telemedicine.  . I have been provided ample opportunity to ask questions regarding this consent and the Services and have had my questions answered to my satisfaction. . I give my informed consent for the services to be provided through the use of telemedicine in my  medical care  By participating in this telemedicine visit I agree to the above.

## 2019-01-22 NOTE — Addendum Note (Signed)
Addended by: Fayrene Fearing B on: 01/22/2019 11:57 AM   Modules accepted: Orders

## 2019-01-23 ENCOUNTER — Telehealth: Payer: Self-pay

## 2019-01-23 NOTE — Telephone Encounter (Signed)
Patient needs to stop the isosorbide.  She needs to keep herself well-hydrated.  Please send a prescription for same medicine for 30 mg isosorbide mononitrate or have her break that tablet into half and try taking it again on Monday morning after a good breakfast and keep [place] posted.  Again patient needs to keep herself well-hydrated

## 2019-01-23 NOTE — Telephone Encounter (Signed)
Patient reports taking first dose of Imdur 60 mg this morning, prescribed at virtual visit yesterday. She began feeling dizzy shortly after taking the medication, developed a headache and checked her BP which was 84/67. Now, several hours later, the dizziness is gone, BP 114/58 but headache persists.    pls advise, tx

## 2019-01-23 NOTE — Telephone Encounter (Signed)
Phoned patient, instructed to stop Imdur until Monday and  Stay hydrated by drinking plenty of water. On Monday morning, she can break the Imdur in half and take 1/2 tab (30mg ) after breakfast. Then check BP after 2 hours and call our office to let us know how she does with that dose. Pt verbalized understanding of this and has no additional questions or concerns.

## 2019-01-28 ENCOUNTER — Other Ambulatory Visit: Payer: Self-pay

## 2019-01-28 ENCOUNTER — Telehealth: Payer: Self-pay

## 2019-01-28 NOTE — Telephone Encounter (Signed)
Phoned patient to follow-up with hypotension since taking imdur.She stopped imdur over the weekend as directed, restarted Monday 1/2 pill. 2 hours after taking med, BP 100/53, HR 77 and got a headache. Tuesday, 104/65 and headache returned. Today 132/80, HR 78 before taking imdur and just took medication.   pls advise, tx

## 2019-01-28 NOTE — Telephone Encounter (Signed)
Please advised the patient to discontinue Imdur.

## 2019-01-28 NOTE — Telephone Encounter (Signed)
Phoned patient, instructed per Dr. Tomie China to stop Imdur. Patient will follow-up in office with Dr. Tomie China on 02/05/19. Patient verbalizes understanding of above will call our office should any further problems arise prior to that.

## 2019-02-05 ENCOUNTER — Telehealth (INDEPENDENT_AMBULATORY_CARE_PROVIDER_SITE_OTHER): Payer: Medicare Other | Admitting: Cardiology

## 2019-02-05 ENCOUNTER — Other Ambulatory Visit: Payer: Self-pay

## 2019-02-05 ENCOUNTER — Encounter: Payer: Self-pay | Admitting: Cardiology

## 2019-02-05 VITALS — BP 132/85 | HR 82 | Ht 66.0 in | Wt 217.0 lb

## 2019-02-05 DIAGNOSIS — E782 Mixed hyperlipidemia: Secondary | ICD-10-CM

## 2019-02-05 DIAGNOSIS — I10 Essential (primary) hypertension: Secondary | ICD-10-CM | POA: Diagnosis not present

## 2019-02-05 DIAGNOSIS — R0789 Other chest pain: Secondary | ICD-10-CM

## 2019-02-05 DIAGNOSIS — Z79899 Other long term (current) drug therapy: Secondary | ICD-10-CM

## 2019-02-05 NOTE — Progress Notes (Signed)
Virtual Visit via Video Note   This visit type was conducted due to national recommendations for restrictions regarding the COVID-19 Pandemic (e.g. social distancing) in an effort to limit this patient's exposure and mitigate transmission in our community.  Due to her co-morbid illnesses, this patient is at least at moderate risk for complications without adequate follow up.  This format is felt to be most appropriate for this patient at this time.  All issues noted in this document were discussed and addressed.  A limited physical exam was performed with this format.  Please refer to the patient's chart for her consent to telehealth for [facility].   Evaluation Performed:  Follow-up visit  Date:  02/05/2019   ID:  Victoria, Barajas 11-17-1945, MRN [id number]  Patient Location: Home Provider Location: Home  PCP:  Abigail Miyamoto, MD  Cardiologist:  No primary care provider on file.  Electrophysiologist:  None   Chief Complaint: Chest tightness  History of Present Illness:    Victoria Barajas is a 73 y.o. female with past medical history of essential hypertension, and being overweight.  She was evaluated for chest tightness.  Her chest tightness symptoms did not appear typical for coronary etiology.  I asked her to embark on an exercise program.  Now she is walking up to half an hour a day 3-4 times a week without any problems.  She has stopped isosorbide because of low blood pressure and bad headaches but currently she is not having any issues with chest tightness  The patient does not have symptoms concerning for COVID-19 infection (fever, chills, cough, or new shortness of breath).    Past Medical History:  Diagnosis Date  . Acid reflux   . High cholesterol   . Migraine    Past Surgical History:  Procedure Laterality Date  . ABDOMINAL HYSTERECTOMY    . TUBAL LIGATION    . WRIST SURGERY       Current Meds  Medication Sig  . aspirin-acetaminophen-caffeine  (EXCEDRIN MIGRAINE) 250-250-65 MG tablet Take 1 tablet by mouth every 6 (six) hours as needed.  Marland Kitchen atorvastatin (LIPITOR) 40 MG tablet Take 1 tablet by mouth daily.  Marland Kitchen lisinopril (PRINIVIL,ZESTRIL) 20 MG tablet Take 1 tablet by mouth daily.  . naproxen sodium (ALEVE) 220 MG tablet Take 220 mg by mouth daily as needed.   . nitroGLYCERIN (NITROSTAT) 0.4 MG SL tablet Place 1 tablet (0.4 mg total) under the tongue every 5 (five) minutes as needed.  Marland Kitchen omeprazole (PRILOSEC) 40 MG capsule Take 40 mg by mouth at bedtime.  . polyethylene glycol (MIRALAX / GLYCOLAX) packet Take 17 g by mouth daily as needed.     Allergies:   Tape   Social History   Tobacco Use  . Smoking status: Former Games developer  . Smokeless tobacco: Never Used  Substance Use Topics  . Alcohol use: No    Frequency: Never  . Drug use: No     Family Hx: The patient's family history includes Alzheimer's disease in her brother; Diabetes in her brother, father, and sister; Heart Problems in her mother; Heart attack in her brother; Kidney disease in her sister.  ROS:   Please see the history of present illness.    As mentioned above All other systems reviewed and are negative.   Prior CV studies:   The following studies were reviewed today:  None  Labs/Other Tests and Data Reviewed:    EKG:  No ECG reviewed.  Recent Labs: No results found  for requested labs within last 8760 hours.   Recent Lipid Panel No results found for: CHOL, TRIG, HDL, CHOLHDL, LDLCALC, LDLDIRECT  Wt Readings from Last 3 Encounters:  02/05/19 217 lb (98.4 kg)  01/22/19 218 lb (98.9 kg)  08/28/18 217 lb (98.4 kg)     Objective:    Vital Signs:  BP 132/85 (BP Location: Left Arm, Patient Position: Sitting, Cuff Size: Normal)   Pulse 82   Ht 5\' 6"  (1.676 m)   Wt 217 lb (98.4 kg)   BMI 35.02 kg/m    VITAL SIGNS:  reviewed  ASSESSMENT & PLAN:    1. Chest tightness: Patient could not tolerate isosorbide she has begun a walking program and  without any symptoms I am very happy about this.  I told her to continue walking at at least 5 days a week and she promises to do so. 2. Diet was discussed and weight reduction was stressed 3. Patient will be seen in follow-up appointment in 2 months or earlier if the patient has any concerns   COVID-19 Education: The signs and symptoms of COVID-19 were discussed with the patient and how to seek care for testing (follow up with PCP or arrange E-visit).  The importance of social distancing was discussed today.  Time:   Today, I have spent 16 minutes with the patient with telehealth technology discussing the above problems.     Medication Adjustments/Labs and Tests Ordered: Current medicines are reviewed at length with the patient today.  Concerns regarding medicines are outlined above.   Tests Ordered: No orders of the defined types were placed in this encounter.   Medication Changes: No orders of the defined types were placed in this encounter.   Disposition:  Follow up in 1 month(s)  Signed, Garwin Brothersajan R Revankar, MD  02/05/2019 11:16 AM    [facility]

## 2019-02-05 NOTE — Patient Instructions (Signed)
Medication Instructions: Your physician recommends that you continue on your current medications as directed. Please refer to the Current Medication list given to you today.  If you need a refill on your cardiac medications before your next appointment, please call your pharmacy.   Lab work: NONE If you have labs (blood work) drawn today and your tests are completely normal, you will receive your results only by: . MyChart Message (if you have MyChart) OR . A paper copy in the mail If you have any lab test that is abnormal or we need to change your treatment, we will call you to review the results.  Testing/Procedures: NONE  Follow-Up: At [facility], you and your health needs are our priority.  As part of our continuing mission to provide you with exceptional heart care, we have created designated Provider Care Teams.  These Care Teams include your primary Cardiologist (physician) and Advanced Practice Providers (APPs -  Physician Assistants and Nurse Practitioners) who all work together to provide you with the care you need, when you need it. You will need a follow up appointment in 2 months.    

## 2019-04-07 ENCOUNTER — Ambulatory Visit: Payer: Medicare Other | Admitting: Cardiology

## 2019-04-09 ENCOUNTER — Ambulatory Visit: Payer: Medicare Other | Admitting: Cardiology

## 2019-04-22 DIAGNOSIS — N3000 Acute cystitis without hematuria: Secondary | ICD-10-CM | POA: Diagnosis not present

## 2019-05-08 DIAGNOSIS — I10 Essential (primary) hypertension: Secondary | ICD-10-CM | POA: Diagnosis not present

## 2019-05-08 DIAGNOSIS — E782 Mixed hyperlipidemia: Secondary | ICD-10-CM | POA: Diagnosis not present

## 2019-05-08 DIAGNOSIS — M4716 Other spondylosis with myelopathy, lumbar region: Secondary | ICD-10-CM | POA: Diagnosis not present

## 2019-05-08 DIAGNOSIS — K210 Gastro-esophageal reflux disease with esophagitis: Secondary | ICD-10-CM | POA: Diagnosis not present

## 2019-09-02 ENCOUNTER — Other Ambulatory Visit (HOSPITAL_BASED_OUTPATIENT_CLINIC_OR_DEPARTMENT_OTHER): Payer: Self-pay | Admitting: Legal Medicine

## 2019-09-02 DIAGNOSIS — Z1231 Encounter for screening mammogram for malignant neoplasm of breast: Secondary | ICD-10-CM

## 2019-09-15 ENCOUNTER — Ambulatory Visit (HOSPITAL_BASED_OUTPATIENT_CLINIC_OR_DEPARTMENT_OTHER)
Admission: RE | Admit: 2019-09-15 | Discharge: 2019-09-15 | Disposition: A | Discharge status: Home / Self Care | Payer: Medicare Other | Source: Ambulatory Visit | Attending: Legal Medicine | Admitting: Legal Medicine

## 2019-09-15 ENCOUNTER — Other Ambulatory Visit: Payer: Self-pay

## 2019-09-15 ENCOUNTER — Encounter (HOSPITAL_BASED_OUTPATIENT_CLINIC_OR_DEPARTMENT_OTHER): Payer: Self-pay

## 2019-09-15 DIAGNOSIS — Z1231 Encounter for screening mammogram for malignant neoplasm of breast: Secondary | ICD-10-CM | POA: Diagnosis not present

## 2019-11-12 DIAGNOSIS — M4716 Other spondylosis with myelopathy, lumbar region: Secondary | ICD-10-CM | POA: Insufficient documentation

## 2019-11-12 HISTORY — DX: Other spondylosis with myelopathy, lumbar region: M47.16

## 2019-11-13 ENCOUNTER — Ambulatory Visit (INDEPENDENT_AMBULATORY_CARE_PROVIDER_SITE_OTHER): Payer: Medicare Other | Admitting: Legal Medicine

## 2019-11-13 ENCOUNTER — Encounter: Payer: Self-pay | Admitting: Legal Medicine

## 2019-11-13 ENCOUNTER — Other Ambulatory Visit: Payer: Self-pay

## 2019-11-13 VITALS — BP 140/80 | HR 80 | Temp 97.6°F | Resp 17 | Ht 66.0 in | Wt 223.0 lb

## 2019-11-13 DIAGNOSIS — E782 Mixed hyperlipidemia: Secondary | ICD-10-CM

## 2019-11-13 DIAGNOSIS — I10 Essential (primary) hypertension: Secondary | ICD-10-CM | POA: Diagnosis not present

## 2019-11-13 DIAGNOSIS — M4716 Other spondylosis with myelopathy, lumbar region: Secondary | ICD-10-CM

## 2019-11-13 NOTE — Progress Notes (Signed)
\   Established Patient Office Visit  Subjective:  Patient ID: Victoria Barajas, female    DOB: 1946-07-19  Age: 74 y.o. MRN: [id number]  CC:  Chief Complaint  Patient presents with  . Hypertension  . Hyperlipidemia  . Gastroesophageal Reflux    HPI Victoria Barajas presents for Chronic visit.  Patient presents for follow up of hypertension.  Patient tolerating lisinopril well with side effects.  Patient was diagnosed with hypertension 2010 so has been treated for hypertension for 10 years.Patient is working on maintaining diet and exercise regimen and follows up as directed.  Patient presents with hyperlipidemia.  Compliance with treatment has been good; patient takes medicines as directed, maintains low cholesterol diet, follows up as directed, and maintains exercise regimen.  Patient is using atorvastatin without problems.  Patient has gastroesophageal reflux symptoms withesophagitis and LTRD.  The symptoms are mild intensity.  Length of symptoms 10 years.  Medicines include omeprazole.  Complications include none.  She will continue omerazole..  Past Medical History:  Diagnosis Date  . Acid reflux   . GERD (gastroesophageal reflux disease)   . High cholesterol   . Hypertension   . Migraine     Past Surgical History:  Procedure Laterality Date  . ABDOMINAL HYSTERECTOMY    . TUBAL LIGATION    . WRIST SURGERY      Family History  Problem Relation Age of Onset  . Heart Problems Mother   . Diabetes Father   . Diabetes Sister   . Kidney disease Sister   . Diabetes Brother   . Alzheimer's disease Brother   . Heart attack Brother     Social History   Socioeconomic History  . Marital status: Married    Spouse name: Not on file  . Number of children: Not on file  . Years of education: Not on file  . Highest education level: Not on file  Occupational History  . Not on file  Tobacco Use  . Smoking status: Former Smoker    Types: Cigarettes    Quit date: 1993   Years since quitting: 28.1  . Smokeless tobacco: Never Used  Substance and Sexual Activity  . Alcohol use: No  . Drug use: No  . Sexual activity: Not Currently  Other Topics Concern  . Not on file  Social History Narrative  . Not on file   Social Determinants of Health   Financial Resource Strain:   . Difficulty of Paying Living Expenses: Not on file  Food Insecurity:   . Worried About Charity fundraiser in the Last Year: Not on file  . Ran Out of Food in the Last Year: Not on file  Transportation Needs:   . Lack of Transportation (Medical): Not on file  . Lack of Transportation (Non-Medical): Not on file  Physical Activity:   . Days of Exercise per Week: Not on file  . Minutes of Exercise per Session: Not on file  Stress:   . Feeling of Stress : Not on file  Social Connections:   . Frequency of Communication with Friends and Family: Not on file  . Frequency of Social Gatherings with Friends and Family: Not on file  . Attends Religious Services: Not on file  . Active Member of Clubs or Organizations: Not on file  . Attends Archivist Meetings: Not on file  . Marital Status: Not on file  Intimate Partner Violence:   . Fear of Current or Ex-Partner: Not on file  . Emotionally  Abused: Not on file  . Physically Abused: Not on file  . Sexually Abused: Not on file    Outpatient Medications Prior to Visit  Medication Sig Dispense Refill  . aspirin-acetaminophen-caffeine (EXCEDRIN MIGRAINE) 250-250-65 MG tablet Take 1 tablet by mouth every 6 (six) hours as needed.    Marland Kitchen atorvastatin (LIPITOR) 40 MG tablet Take 1 tablet by mouth daily.  0  . lisinopril (PRINIVIL,ZESTRIL) 20 MG tablet Take 1 tablet by mouth daily.    . naproxen sodium (ALEVE) 220 MG tablet Take 220 mg by mouth daily as needed.     . nitroGLYCERIN (NITROSTAT) 0.4 MG SL tablet Place 1 tablet (0.4 mg total) under the tongue every 5 (five) minutes as needed. 25 tablet 11  . omeprazole (PRILOSEC) 40 MG  capsule Take 40 mg by mouth at bedtime.    . polyethylene glycol (MIRALAX / GLYCOLAX) packet Take 17 g by mouth daily as needed.     No facility-administered medications prior to visit.    Allergies  Allergen Reactions  . Tape Rash    SOME ADHESIVES LEAVE RASH, RED AREA    ROS Review of Systems  Constitutional: Negative.   HENT: Negative.   Eyes: Negative.   Respiratory: Negative.   Cardiovascular: Negative.   Gastrointestinal: Negative.   Genitourinary: Negative.   Musculoskeletal: Negative.   Skin: Negative.   Neurological: Negative.   Psychiatric/Behavioral: Negative.       Objective:    Physical Exam  Constitutional: She is oriented to person, place, and time. She appears well-developed and well-nourished.  HENT:  Head: Normocephalic and atraumatic.  Eyes: EOM are normal.  Cardiovascular: Normal rate, regular rhythm, normal heart sounds and intact distal pulses.  Pulmonary/Chest: Effort normal and breath sounds normal.  Abdominal: Soft. Bowel sounds are normal.  Musculoskeletal:        General: Normal range of motion.     Cervical back: Normal range of motion and neck supple.  Neurological: She is alert and oriented to person, place, and time. She has normal reflexes.  Skin: Skin is warm.  Psychiatric: She has a normal mood and affect. Her behavior is normal.    BP 140/80 (BP Location: Right Arm, Patient Position: Sitting)   Pulse 80   Temp 97.6 F (36.4 C) (Temporal)   Resp 17   Ht 5\' 6"  (1.676 m)   Wt 223 lb (101.2 kg)   BMI 35.99 kg/m  Wt Readings from Last 3 Encounters:  11/13/19 223 lb (101.2 kg)  02/05/19 217 lb (98.4 kg)  01/22/19 218 lb (98.9 kg)     Health Maintenance Due  Topic Date Due  . Hepatitis C Screening  09-Feb-1946  . COLONOSCOPY  07/30/1997  . DEXA SCAN  07/30/2012  . INFLUENZA VACCINE  05/09/2019    There are no preventive care reminders to display for this patient.  No results found for: TSH Lab Results  Component  Value Date   WBC 6.4 11/13/2019   HGB 13.9 11/13/2019   HCT 41.4 11/13/2019   MCV 88 11/13/2019   PLT 226 11/13/2019   Lab Results  Component Value Date   NA 143 11/13/2019   K 5.0 11/13/2019   CO2 21 11/13/2019   GLUCOSE 100 (H) 11/13/2019   BUN 19 11/13/2019   CREATININE 0.93 11/13/2019   BILITOT 1.3 (H) 11/13/2019   ALKPHOS 85 11/13/2019   AST 17 11/13/2019   ALT 18 11/13/2019   PROT 6.4 11/13/2019   ALBUMIN 4.5 11/13/2019   CALCIUM  10.1 11/13/2019   ANIONGAP 14 11/08/2017   Lab Results  Component Value Date   CHOL 143 11/13/2019   Lab Results  Component Value Date   HDL 44 11/13/2019   Lab Results  Component Value Date   LDLCALC 75 11/13/2019   Lab Results  Component Value Date   TRIG 133 11/13/2019   Lab Results  Component Value Date   CHOLHDL 3.3 11/13/2019   No results found for: HGBA1C    Assessment & Plan:   Problem List Items Addressed This Visit      Cardiovascular and Mediastinum   Essential hypertension - Primary    An individual care plan was established and reinforced today.  The patient's status was assessed using clinical findings on exam and labs or diagnostic tests. The patient's success at meeting treatment goals on disease specific evidence-based guidelines and found to be good controlled. SELF MANAGEMENT: The patient and I together assessed ways to personally work towards obtaining the recommended goals. RECOMMENDATIONS: avoid decongestants found in common cold remedies, decrease consumption of alcohol, perform routine monitoring of BP with home BP cuff, exercise, reduction of dietary salt, take medicines as prescribed, try not to miss doses and quit smoking.  Regular exercise and maintaining a healthy weight is needed.  Stress reduction may help. A CLINICAL SUMMARY including written plan identify barriers to care unique to individual due to social or financial issues.  We attempt to mutually creat solutions for individual and family  understanding.      Relevant Orders   CBC with Differential (Completed)   Comprehensive metabolic panel (Completed)     Nervous and Auditory   Spondylosis with myelopathy, lumbar region (Chronic)    AN INDIVIDUAL CARE PLAN was established and reinforced today.  The patient's status was assessed using clinical findings on exam, labs, and other diagnostic testing. Patient's success at meeting treatment goals based on disease specific evidence-bassed guidelines and found to be in fair control. RECOMMENDATIONS include maintaining present medicines and treatment.  His back pain is stable and he is tolerating it.        Other   Mixed hyperlipidemia    AN INDIVIDUAL CARE PLAN was established and reinforced today.  The patient's status was assessed using clinical findings on exam, lab and other diagnostic tests. The patient's disease status was assessed based on evidence-based guidelines and found to be good controlled. MEDICATIONS were reviewed. SELF MANAGEMENT GOALS have been discussed and patient's success at attaining the goal of low cholesterol was assessed. RECOMMENDATION given include regular exercise 3 days a week and low cholesterol/low fat diet. CLINICAL SUMMARY including written plan to identify barriers unique to the patient due to social or economic  reasons was discussed.       Relevant Orders   Lipid Panel (Completed)      No orders of the defined types were placed in this encounter.   Follow-up: Return in about 4 months (around 03/12/2020) for fasting.    Brent Bulla, MD

## 2019-11-14 LAB — CBC WITH DIFFERENTIAL/PLATELET
Basophils Absolute: 0 10*3/uL (ref 0.0–0.2)
Basos: 1 %
EOS (ABSOLUTE): 0.1 10*3/uL (ref 0.0–0.4)
Eos: 1 %
Hematocrit: 41.4 % (ref 34.0–46.6)
Hemoglobin: 13.9 g/dL (ref 11.1–15.9)
Immature Grans (Abs): 0 10*3/uL (ref 0.0–0.1)
Immature Granulocytes: 0 %
Lymphocytes Absolute: 1.1 10*3/uL (ref 0.7–3.1)
Lymphs: 17 %
MCH: 29.4 pg (ref 26.6–33.0)
MCHC: 33.6 g/dL (ref 31.5–35.7)
MCV: 88 fL (ref 79–97)
Monocytes Absolute: 0.5 10*3/uL (ref 0.1–0.9)
Monocytes: 7 %
Neutrophils Absolute: 4.7 10*3/uL (ref 1.4–7.0)
Neutrophils: 74 %
Platelets: 226 10*3/uL (ref 150–450)
RBC: 4.73 x10E6/uL (ref 3.77–5.28)
RDW: 12.6 % (ref 11.7–15.4)
WBC: 6.4 10*3/uL (ref 3.4–10.8)

## 2019-11-14 LAB — COMPREHENSIVE METABOLIC PANEL
ALT: 18 IU/L (ref 0–32)
AST: 17 IU/L (ref 0–40)
Albumin/Globulin Ratio: 2.4 — ABNORMAL HIGH (ref 1.2–2.2)
Albumin: 4.5 g/dL (ref 3.7–4.7)
Alkaline Phosphatase: 85 IU/L (ref 39–117)
BUN/Creatinine Ratio: 20 (ref 12–28)
BUN: 19 mg/dL (ref 8–27)
Bilirubin Total: 1.3 mg/dL — ABNORMAL HIGH (ref 0.0–1.2)
CO2: 21 mmol/L (ref 20–29)
Calcium: 10.1 mg/dL (ref 8.7–10.3)
Chloride: 106 mmol/L (ref 96–106)
Creatinine, Ser: 0.93 mg/dL (ref 0.57–1.00)
GFR calc Af Amer: 71 mL/min/{1.73_m2} (ref >59)
GFR calc non Af Amer: 62 mL/min/{1.73_m2} (ref >59)
Globulin, Total: 1.9 g/dL (ref 1.5–4.5)
Glucose: 100 mg/dL — ABNORMAL HIGH (ref 65–99)
Potassium: 5 mmol/L (ref 3.5–5.2)
Sodium: 143 mmol/L (ref 134–144)
Total Protein: 6.4 g/dL (ref 6.0–8.5)

## 2019-11-14 LAB — LIPID PANEL
Chol/HDL Ratio: 3.3 ratio (ref 0.0–4.4)
Cholesterol, Total: 143 mg/dL (ref 100–199)
HDL: 44 mg/dL (ref >39)
LDL Chol Calc (NIH): 75 mg/dL (ref 0–99)
Triglycerides: 133 mg/dL (ref 0–149)
VLDL Cholesterol Cal: 24 mg/dL (ref 5–40)

## 2019-11-14 LAB — CARDIOVASCULAR RISK ASSESSMENT

## 2019-11-14 NOTE — Assessment & Plan Note (Signed)
An individual care plan was established and reinforced today.  The patient's status was assessed using clinical findings on exam and labs or diagnostic tests. The patient's success at meeting treatment goals on disease specific evidence-based guidelines and found to be good controlled. SELF MANAGEMENT: The patient and I together assessed ways to personally work towards obtaining the recommended goals. RECOMMENDATIONS: avoid decongestants found in common cold remedies, decrease consumption of alcohol, perform routine monitoring of BP with home BP cuff, exercise, reduction of dietary salt, take medicines as prescribed, try not to miss doses and quit smoking.  Regular exercise and maintaining a healthy weight is needed.  Stress reduction may help. A CLINICAL SUMMARY including written plan identify barriers to care unique to individual due to social or financial issues.  We attempt to mutually creat solutions for individual and family understanding. 

## 2019-11-14 NOTE — Assessment & Plan Note (Signed)
AN INDIVIDUAL CARE PLAN was established and reinforced today.  The patient's status was assessed using clinical findings on exam, lab and other diagnostic tests. The patient's disease status was assessed based on evidence-based guidelines and found to be good controlled. MEDICATIONS were reviewed. SELF MANAGEMENT GOALS have been discussed and patient's success at attaining the goal of low cholesterol was assessed. RECOMMENDATION given include regular exercise 3 days a week and low cholesterol/low fat diet. CLINICAL SUMMARY including written plan to identify barriers unique to the patient due to social or economic  reasons was discussed. 

## 2019-11-14 NOTE — Assessment & Plan Note (Signed)
AN INDIVIDUAL CARE PLAN was established and reinforced today.  The patient's status was assessed using clinical findings on exam, labs, and other diagnostic testing. Patient's success at meeting treatment goals based on disease specific evidence-bassed guidelines and found to be in fair control. RECOMMENDATIONS include maintaining present medicines and treatment.  His back pain is stable and he is tolerating it.

## 2019-11-15 NOTE — Progress Notes (Signed)
CBC all normal, bilirubin high, any gall bladder symptoms?, Cholesterol normal lp

## 2020-01-09 IMAGING — MG DIGITAL SCREENING BILAT W/ TOMO W/ CAD
6 of 12 series · 6 of 36 positions shown · non-contrast
Comparison: Previous exam(s).

CLINICAL DATA: Screening.

EXAM:
DIGITAL SCREENING BILATERAL MAMMOGRAM WITH TOMO AND CAD

[R XCCL synth-2D]
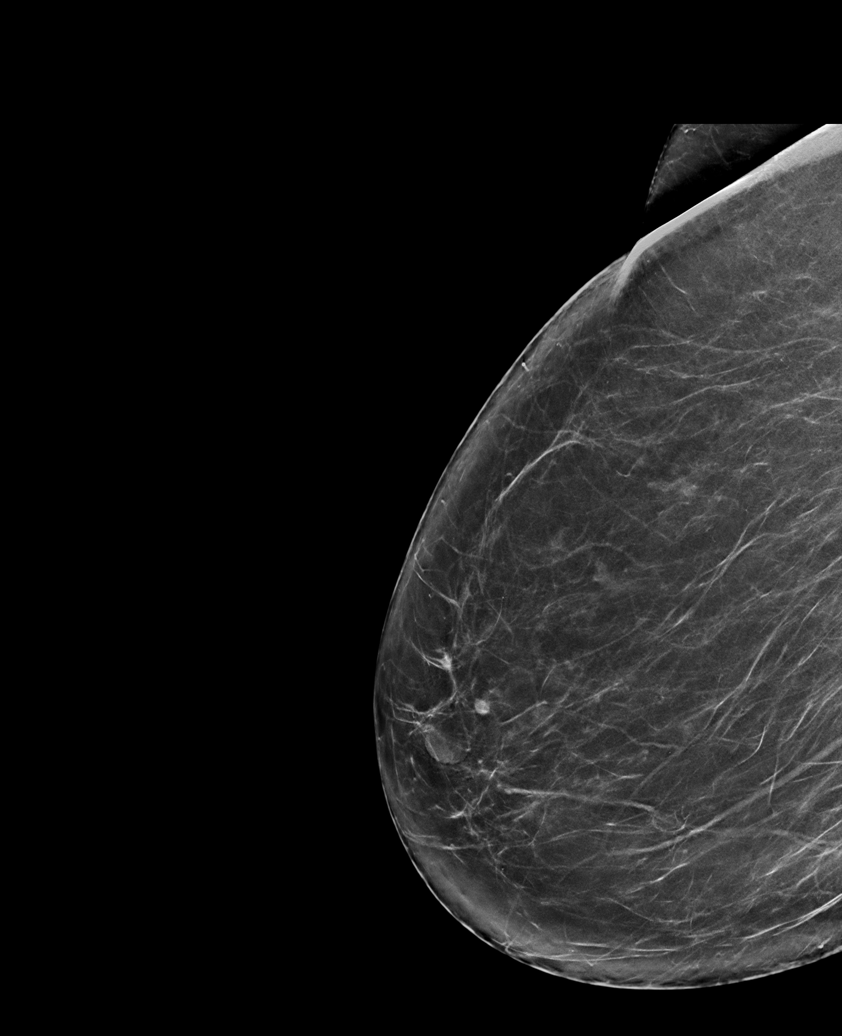

[R CC synth-2D]
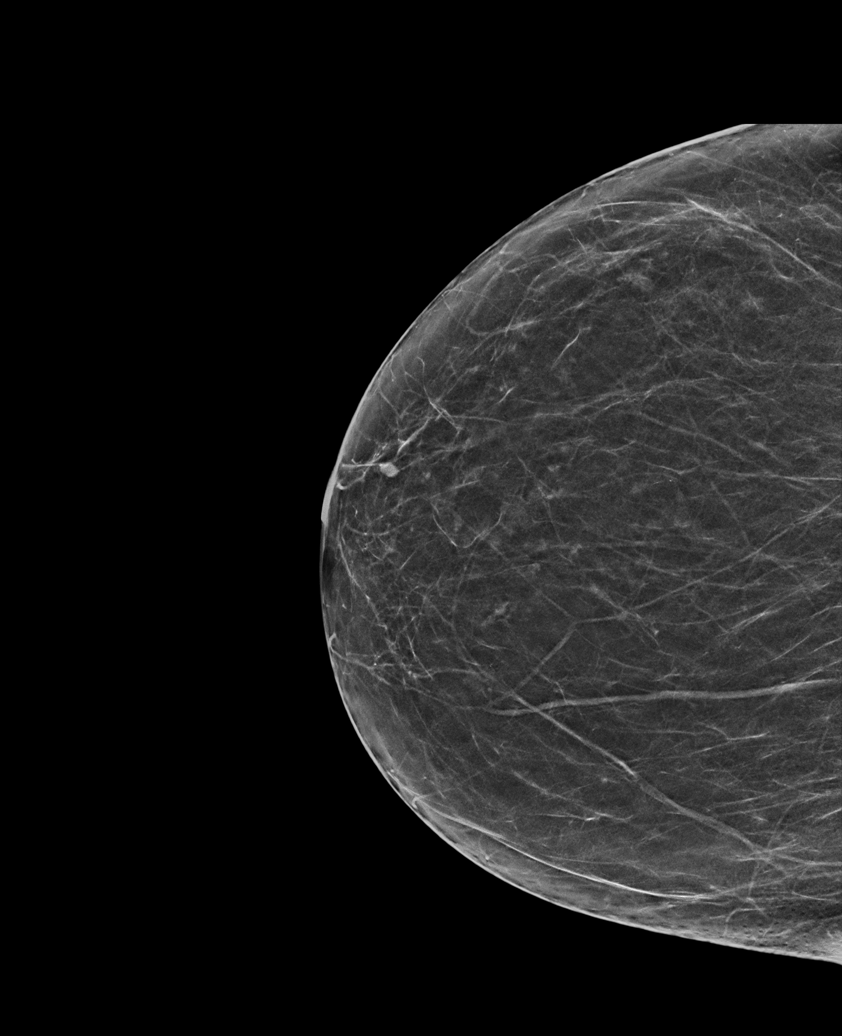

[L CC synth-2D]
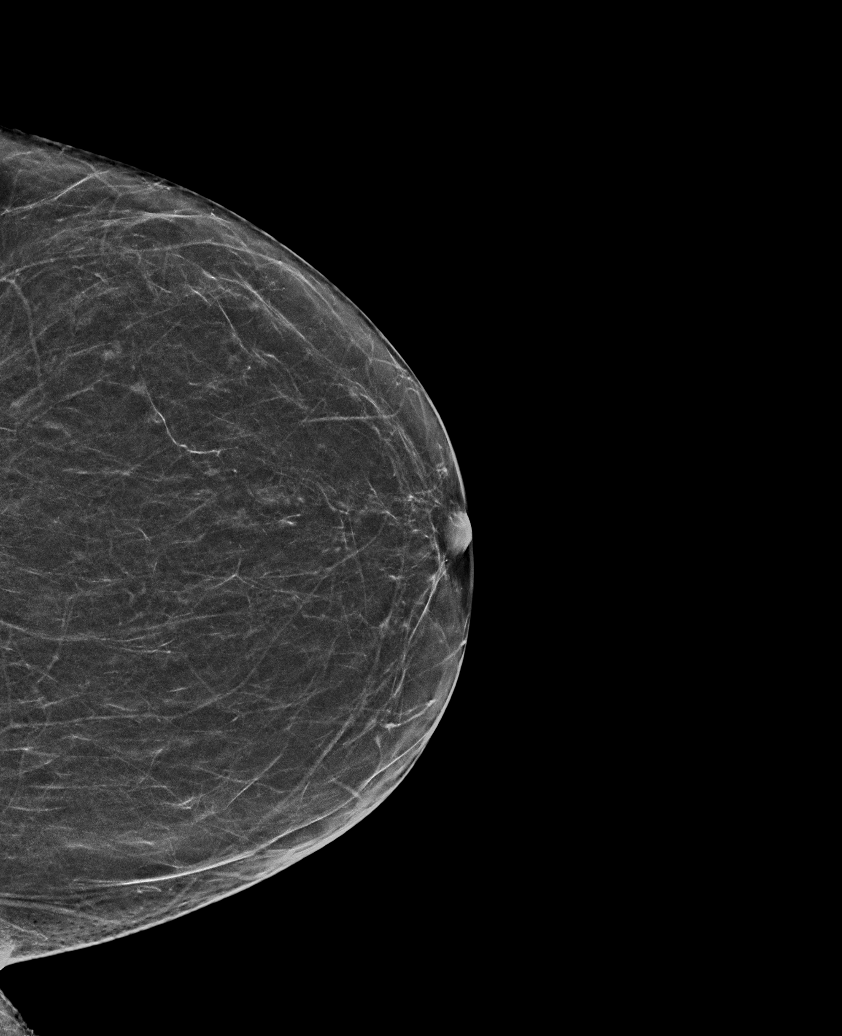

[L XCCL synth-2D]
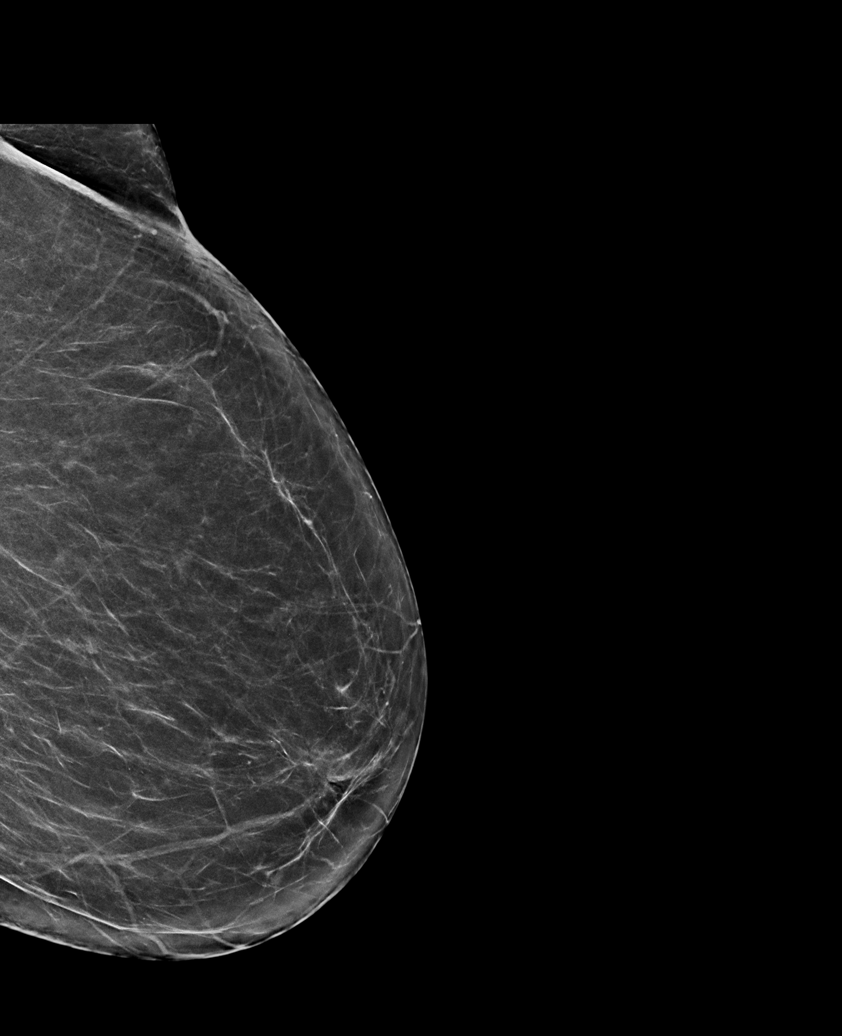

[R MLO synth-2D]
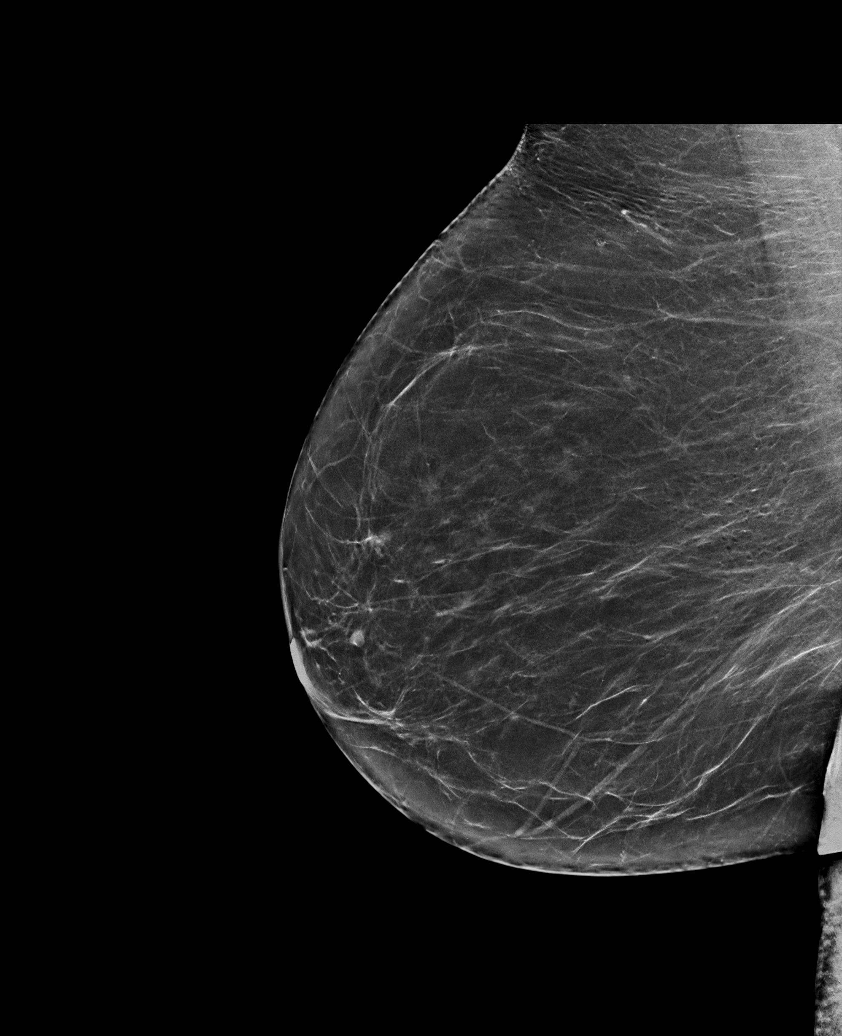

[L MLO synth-2D]
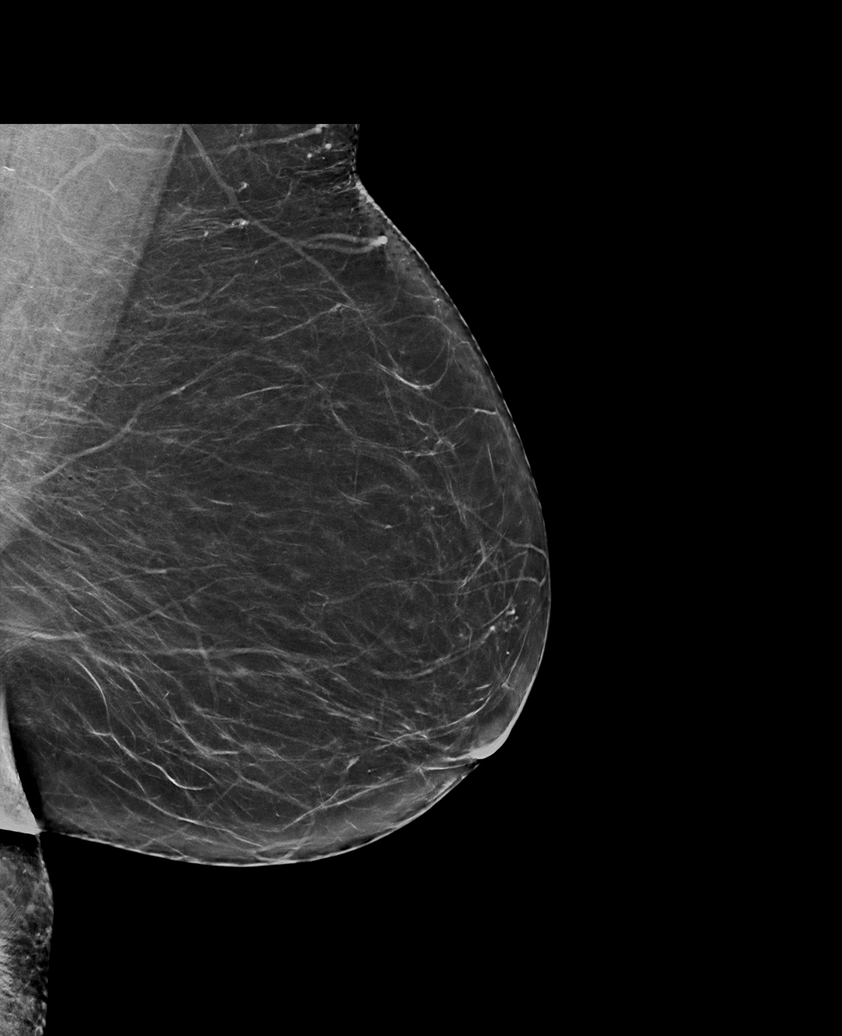

[6 of 36 positions shown; findings below may reference images not displayed]

ACR Breast Density Category b: There are scattered areas of
fibroglandular density.
FINDINGS: There are no findings suspicious for malignancy. Images were
processed with CAD.
IMPRESSION: No mammographic evidence of malignancy. A result letter of this
screening mammogram will be mailed directly to the patient.

RECOMMENDATION:
Screening mammogram in one year. (Code:CN-U-775)

BI-RADS CATEGORY  1: Negative.

## 2020-01-11 ENCOUNTER — Other Ambulatory Visit: Payer: Self-pay | Admitting: Legal Medicine

## 2020-02-17 ENCOUNTER — Encounter: Payer: Self-pay | Admitting: Legal Medicine

## 2020-02-17 ENCOUNTER — Ambulatory Visit (INDEPENDENT_AMBULATORY_CARE_PROVIDER_SITE_OTHER): Payer: Medicare Other | Admitting: Legal Medicine

## 2020-02-17 ENCOUNTER — Other Ambulatory Visit: Payer: Self-pay

## 2020-02-17 VITALS — BP 124/80 | HR 59 | Temp 97.3°F | Resp 18 | Ht 65.5 in | Wt 221.0 lb

## 2020-02-17 DIAGNOSIS — M71012 Abscess of bursa, left shoulder: Secondary | ICD-10-CM

## 2020-02-17 NOTE — Progress Notes (Signed)
Acute Office Visit  Subjective:    Patient ID: Victoria Barajas, female    DOB: January 10, 1946, 74 y.o.   MRN: [id number]  Chief Complaint  Patient presents with  . Cyst    Left shoulder    HPI Patient is in today for Patient has tender area with swelling and redness left shoulder.  It is draining purulent material.  We go informed consent to I & D abscess.  Past Medical History:  Diagnosis Date  . Acid reflux   . GERD (gastroesophageal reflux disease)   . High cholesterol   . Hypertension   . Migraine     Past Surgical History:  Procedure Laterality Date  . ABDOMINAL HYSTERECTOMY    . TUBAL LIGATION    . WRIST SURGERY      Family History  Problem Relation Age of Onset  . Heart Problems Mother   . Diabetes Father   . Diabetes Sister   . Kidney disease Sister   . Diabetes Brother   . Alzheimer's disease Brother   . Heart attack Brother     Social History   Socioeconomic History  . Marital status: Married    Spouse name: Not on file  . Number of children: Not on file  . Years of education: Not on file  . Highest education level: Not on file  Occupational History  . Not on file  Tobacco Use  . Smoking status: Former Smoker    Types: Cigarettes    Quit date: 1993    Years since quitting: 28.3  . Smokeless tobacco: Never Used  Substance and Sexual Activity  . Alcohol use: No  . Drug use: No  . Sexual activity: Not Currently  Other Topics Concern  . Not on file  Social History Narrative  . Not on file   Social Determinants of Health   Financial Resource Strain:   . Difficulty of Paying Living Expenses:   Food Insecurity:   . Worried About Charity fundraiser in the Last Year:   . Arboriculturist in the Last Year:   Transportation Needs:   . Film/video editor (Medical):   Marland Kitchen Lack of Transportation (Non-Medical):   Physical Activity:   . Days of Exercise per Week:   . Minutes of Exercise per Session:   Stress:   . Feeling of Stress :   Social  Connections:   . Frequency of Communication with Friends and Family:   . Frequency of Social Gatherings with Friends and Family:   . Attends Religious Services:   . Active Member of Clubs or Organizations:   . Attends Archivist Meetings:   Marland Kitchen Marital Status:   Intimate Partner Violence:   . Fear of Current or Ex-Partner:   . Emotionally Abused:   Marland Kitchen Physically Abused:   . Sexually Abused:     Outpatient Medications Prior to Visit  Medication Sig Dispense Refill  . aspirin-acetaminophen-caffeine (EXCEDRIN MIGRAINE) 250-250-65 MG tablet Take 1 tablet by mouth every 6 (six) hours as needed.    Marland Kitchen atorvastatin (LIPITOR) 40 MG tablet Take 1 tablet by mouth daily.  0  . lisinopril (PRINIVIL,ZESTRIL) 20 MG tablet Take 1 tablet by mouth daily.    . naproxen sodium (ALEVE) 220 MG tablet Take 220 mg by mouth daily as needed.     Marland Kitchen omeprazole (PRILOSEC) 40 MG capsule TAKE 1 CAPSULE BY MOUTH EVERY NIGHT AT BEDTIME 90 capsule 2  . polyethylene glycol (MIRALAX / GLYCOLAX) packet Take  17 g by mouth daily as needed.    . nitroGLYCERIN (NITROSTAT) 0.4 MG SL tablet Place 1 tablet (0.4 mg total) under the tongue every 5 (five) minutes as needed. 25 tablet 11   No facility-administered medications prior to visit.    Allergies  Allergen Reactions  . Tape Rash    SOME ADHESIVES LEAVE RASH, RED AREA    Review of Systems  Constitutional: Negative.   HENT: Negative.   Respiratory: Negative.   Cardiovascular: Negative.   Gastrointestinal: Negative.   Endocrine: Negative.   Musculoskeletal: Negative.   Skin:       Abscess left shoulder  Psychiatric/Behavioral: Negative.        Objective:    Physical Exam Vitals reviewed.  Constitutional:      Appearance: Normal appearance.  Skin:    Findings: Abscess present.          Comments: Abscess left shoulder 2cm, I & D using 1% xylocaine with epinephrine.  Keratinous material removed and contents cultured, cyst wall removed and area  packed.  Neurological:     Mental Status: She is alert.     BP 124/80   Pulse (!) 59   Temp (!) 97.3 F (36.3 C)   Resp 18   Ht 5' 5.5" (1.664 m)   Wt 221 lb (100.2 kg)   SpO2 94%   BMI 36.22 kg/m  Wt Readings from Last 3 Encounters:  02/17/20 221 lb (100.2 kg)  11/13/19 223 lb (101.2 kg)  02/05/19 217 lb (98.4 kg)    Health Maintenance Due  Topic Date Due  . Hepatitis C Screening  Never done  . COVID-19 Vaccine (1) Never done  . COLONOSCOPY  Never done  . DEXA SCAN  Never done    There are no preventive care reminders to display for this patient.   No results found for: TSH Lab Results  Component Value Date   WBC 6.4 11/13/2019   HGB 13.9 11/13/2019   HCT 41.4 11/13/2019   MCV 88 11/13/2019   PLT 226 11/13/2019   Lab Results  Component Value Date   NA 143 11/13/2019   K 5.0 11/13/2019   CO2 21 11/13/2019   GLUCOSE 100 (H) 11/13/2019   BUN 19 11/13/2019   CREATININE 0.93 11/13/2019   BILITOT 1.3 (H) 11/13/2019   ALKPHOS 85 11/13/2019   AST 17 11/13/2019   ALT 18 11/13/2019   PROT 6.4 11/13/2019   ALBUMIN 4.5 11/13/2019   CALCIUM 10.1 11/13/2019   ANIONGAP 14 11/08/2017   Lab Results  Component Value Date   CHOL 143 11/13/2019   Lab Results  Component Value Date   HDL 44 11/13/2019   Lab Results  Component Value Date   LDLCALC 75 11/13/2019   Lab Results  Component Value Date   TRIG 133 11/13/2019   Lab Results  Component Value Date   CHOLHDL 3.3 11/13/2019   No results found for: HGBA1C     Assessment & Plan:   Problem List Items Addressed This Visit      Musculoskeletal and Integument   Abscess of bursa of left shoulder - Primary    Patient has 2 to 3cm abscess left shoulder that is draining and painful for 2 days.  Informed consent obtained.  Betadine local and 3cc of 1% Xylocaine with epinephrine for local anesthetics.  The skin was cut with #11 blade and purulent and keratin material exuded.  Culture performed.  The cyst wall  was removed in toto.  Iodoform packing installed. No blood loss or comlications.      Relevant Orders   Aerobic Culture       Return in about 2 days (around 02/19/2020) for for abscess.     No orders of the defined types were placed in this encounter.    Brent Bulla, MD

## 2020-02-17 NOTE — Patient Instructions (Signed)
Wound Care, Adult Taking care of your wound properly can help to prevent pain, infection, and scarring. It can also help your wound to heal more quickly. How to care for your wound Wound care      Follow instructions from your health care provider about how to take care of your wound. Make sure you: ? Wash your hands with soap and water before you change the bandage (dressing). If soap and water are not available, use hand sanitizer. ? Change your dressing as told by your health care provider. ? Leave stitches (sutures), skin glue, or adhesive strips in place. These skin closures may need to stay in place for 2 weeks or longer. If adhesive strip edges start to loosen and curl up, you may trim the loose edges. Do not remove adhesive strips completely unless your health care provider tells you to do that.  Check your wound area every day for signs of infection. Check for: ? Redness, swelling, or pain. ? Fluid or blood. ? Warmth. ? Pus or a bad smell.  Ask your health care provider if you should clean the wound with mild soap and water. Doing this may include: ? Using a clean towel to pat the wound dry after cleaning it. Do not rub or scrub the wound. ? Applying a cream or ointment. Do this only as told by your health care provider. ? Covering the incision with a clean dressing.  Ask your health care provider when you can leave the wound uncovered.  Keep the dressing dry until your health care provider says it can be removed. Do not take baths, swim, use a hot tub, or do anything that would put the wound underwater until your health care provider approves. Ask your health care provider if you can take showers. You may only be allowed to take sponge baths. Medicines   If you were prescribed an antibiotic medicine, cream, or ointment, take or use the antibiotic as told by your health care provider. Do not stop taking or using the antibiotic even if your condition improves.  Take  over-the-counter and prescription medicines only as told by your health care provider. If you were prescribed pain medicine, take it 30 or more minutes before you do any wound care or as told by your health care provider. General instructions  Return to your normal activities as told by your health care provider. Ask your health care provider what activities are safe.  Do not scratch or pick at the wound.  Do not use any products that contain nicotine or tobacco, such as cigarettes and e-cigarettes. These may delay wound healing. If you need help quitting, ask your health care provider.  Keep all follow-up visits as told by your health care provider. This is important.  Eat a diet that includes protein, vitamin A, vitamin C, and other nutrient-rich foods to help the wound heal. ? Foods rich in protein include meat, dairy, beans, nuts, and other sources. ? Foods rich in vitamin A include carrots and dark green, leafy vegetables. ? Foods rich in vitamin C include citrus, tomatoes, and other fruits and vegetables. ? Nutrient-rich foods have protein, carbohydrates, fat, vitamins, or minerals. Eat a variety of healthy foods including vegetables, fruits, and whole grains. Contact a health care provider if:  You received a tetanus shot and you have swelling, severe pain, redness, or bleeding at the injection site.  Your pain is not controlled with medicine.  You have redness, swelling, or pain around the wound.    You have fluid or blood coming from the wound.  Your wound feels warm to the touch.  You have pus or a bad smell coming from the wound.  You have a fever or chills.  You are nauseous or you vomit.  You are dizzy. Get help right away if:  You have a red streak going away from your wound.  The edges of the wound open up and separate.  Your wound is bleeding, and the bleeding does not stop with gentle pressure.  You have a rash.  You faint.  You have trouble  breathing. Summary  Always wash your hands with soap and water before changing your bandage (dressing).  To help with healing, eat foods that are rich in protein, vitamin A, vitamin C, and other nutrients.  Check your wound every day for signs of infection. Contact your health care provider if you suspect that your wound is infected. This information is not intended to replace advice given to you by your health care provider. Make sure you discuss any questions you have with your health care provider. Document Revised: 01/12/2019 Document Reviewed: 04/10/2016 Elsevier Patient Education  2020 [employer]  

## 2020-02-17 NOTE — Assessment & Plan Note (Signed)
Patient has 2 to 3cm abscess left shoulder that is draining and painful for 2 days.  Informed consent obtained.  Betadine local and 3cc of 1% Xylocaine with epinephrine for local anesthetics.  The skin was cut with #11 blade and purulent and keratin material exuded.  Culture performed.  The cyst wall was removed in toto.  Iodoform packing installed. No blood loss or comlications.

## 2020-02-19 ENCOUNTER — Other Ambulatory Visit: Payer: Self-pay

## 2020-02-19 ENCOUNTER — Encounter: Payer: Self-pay | Admitting: Legal Medicine

## 2020-02-19 ENCOUNTER — Ambulatory Visit (INDEPENDENT_AMBULATORY_CARE_PROVIDER_SITE_OTHER): Payer: Medicare Other | Admitting: Legal Medicine

## 2020-02-19 VITALS — BP 124/78 | HR 78 | Temp 97.4°F | Resp 16 | Ht 65.0 in | Wt 220.6 lb

## 2020-02-19 DIAGNOSIS — M71012 Abscess of bursa, left shoulder: Secondary | ICD-10-CM

## 2020-02-19 DIAGNOSIS — L02414 Cutaneous abscess of left upper limb: Secondary | ICD-10-CM

## 2020-02-19 NOTE — Progress Notes (Signed)
Acute Office Visit  Subjective:    Patient ID: Victoria Barajas, female    DOB: 1945/11/24, 74 y.o.   MRN: [id number]  Chief Complaint  Patient presents with  . Abcess of bursa of left shoulder    HPI Patient is in today for Patient has tender area with swelling and redness left shoulder.  It is draining purulent material.  We got informed consent to I & D abscess.  02/19/20 patient returns for packing removal.  Less drainage and less pain.  Past Medical History:  Diagnosis Date  . Acid reflux   . GERD (gastroesophageal reflux disease)   . High cholesterol   . Hypertension   . Migraine     Past Surgical History:  Procedure Laterality Date  . ABDOMINAL HYSTERECTOMY    . TUBAL LIGATION    . WRIST SURGERY      Family History  Problem Relation Age of Onset  . Heart Problems Mother   . Diabetes Father   . Diabetes Sister   . Kidney disease Sister   . Diabetes Brother   . Alzheimer's disease Brother   . Heart attack Brother     Social History   Socioeconomic History  . Marital status: Married    Spouse name: Not on file  . Number of children: Not on file  . Years of education: Not on file  . Highest education level: Not on file  Occupational History  . Not on file  Tobacco Use  . Smoking status: Former Smoker    Types: Cigarettes    Quit date: 1993    Years since quitting: 28.3  . Smokeless tobacco: Never Used  Substance and Sexual Activity  . Alcohol use: No  . Drug use: No  . Sexual activity: Not Currently  Other Topics Concern  . Not on file  Social History Narrative  . Not on file   Social Determinants of Health   Financial Resource Strain:   . Difficulty of Paying Living Expenses:   Food Insecurity:   . Worried About Charity fundraiser in the Last Year:   . Arboriculturist in the Last Year:   Transportation Needs:   . Film/video editor (Medical):   Marland Kitchen Lack of Transportation (Non-Medical):   Physical Activity:   . Days of Exercise per  Week:   . Minutes of Exercise per Session:   Stress:   . Feeling of Stress :   Social Connections:   . Frequency of Communication with Friends and Family:   . Frequency of Social Gatherings with Friends and Family:   . Attends Religious Services:   . Active Member of Clubs or Organizations:   . Attends Archivist Meetings:   Marland Kitchen Marital Status:   Intimate Partner Violence:   . Fear of Current or Ex-Partner:   . Emotionally Abused:   Marland Kitchen Physically Abused:   . Sexually Abused:     Outpatient Medications Prior to Visit  Medication Sig Dispense Refill  . aspirin-acetaminophen-caffeine (EXCEDRIN MIGRAINE) 250-250-65 MG tablet Take 1 tablet by mouth every 6 (six) hours as needed.    Marland Kitchen atorvastatin (LIPITOR) 40 MG tablet Take 1 tablet by mouth daily.  0  . lisinopril (PRINIVIL,ZESTRIL) 20 MG tablet Take 1 tablet by mouth daily.    . naproxen sodium (ALEVE) 220 MG tablet Take 220 mg by mouth daily as needed.     . nitroGLYCERIN (NITROSTAT) 0.4 MG SL tablet Place 1 tablet (0.4 mg total) under  the tongue every 5 (five) minutes as needed. 25 tablet 11  . omeprazole (PRILOSEC) 40 MG capsule TAKE 1 CAPSULE BY MOUTH EVERY NIGHT AT BEDTIME 90 capsule 2  . polyethylene glycol (MIRALAX / GLYCOLAX) packet Take 17 g by mouth daily as needed.     No facility-administered medications prior to visit.    Allergies  Allergen Reactions  . Tape Rash    SOME ADHESIVES LEAVE RASH, RED AREA    Review of Systems  Constitutional: Negative.   HENT: Negative.   Respiratory: Negative.   Cardiovascular: Negative.   Gastrointestinal: Negative.   Endocrine: Negative.   Musculoskeletal: Negative.   Skin:       Abscess left shoulder  Psychiatric/Behavioral: Negative.        Objective:    Physical Exam  Constitutional: She is well-developed, well-nourished, and in no distress.  Cardiovascular: Normal rate, regular rhythm, normal heart sounds and intact distal pulses.  Pulmonary/Chest: Effort  normal and breath sounds normal.  Skin:  The wound is open but no infected the packing was removed and the area redressed.  Vitals reviewed.  BP 124/78   Pulse 78   Temp (!) 97.4 F (36.3 C)   Resp 16   Ht 5\' 5"  (1.651 m)   Wt 220 lb 9.6 oz (100.1 kg)   SpO2 99%   BMI 36.71 kg/m  Wt Readings from Last 3 Encounters:  02/19/20 220 lb 9.6 oz (100.1 kg)  02/17/20 221 lb (100.2 kg)  11/13/19 223 lb (101.2 kg)    Health Maintenance Due  Topic Date Due  . Hepatitis C Screening  Never done  . COVID-19 Vaccine (1) Never done  . COLONOSCOPY  Never done  . DEXA SCAN  Never done    There are no preventive care reminders to display for this patient.   No results found for: TSH Lab Results  Component Value Date   WBC 6.4 11/13/2019   HGB 13.9 11/13/2019   HCT 41.4 11/13/2019   MCV 88 11/13/2019   PLT 226 11/13/2019   Lab Results  Component Value Date   NA 143 11/13/2019   K 5.0 11/13/2019   CO2 21 11/13/2019   GLUCOSE 100 (H) 11/13/2019   BUN 19 11/13/2019   CREATININE 0.93 11/13/2019   BILITOT 1.3 (H) 11/13/2019   ALKPHOS 85 11/13/2019   AST 17 11/13/2019   ALT 18 11/13/2019   PROT 6.4 11/13/2019   ALBUMIN 4.5 11/13/2019   CALCIUM 10.1 11/13/2019   ANIONGAP 14 11/08/2017   Lab Results  Component Value Date   CHOL 143 11/13/2019   Lab Results  Component Value Date   HDL 44 11/13/2019   Lab Results  Component Value Date   LDLCALC 75 11/13/2019   Lab Results  Component Value Date   TRIG 133 11/13/2019   Lab Results  Component Value Date   CHOLHDL 3.3 11/13/2019   No results found for: HGBA1C     Assessment & Plan:   Problem List Items Addressed This Visit      Musculoskeletal and Integument   Abscess of skin of left shoulder    Packing removed, no drainage.  Still some keritinous material.  Redressed.       Other Visit Diagnoses    Abscess of bursa of left shoulder    -  Primary       Return in about 1 week (around 02/26/2020) for I & D  follow up.     No orders of the  defined types were placed in this encounter.    Brent Bulla, MD

## 2020-02-21 DIAGNOSIS — L02414 Cutaneous abscess of left upper limb: Secondary | ICD-10-CM | POA: Insufficient documentation

## 2020-02-21 LAB — AEROBIC CULTURE

## 2020-02-21 NOTE — Assessment & Plan Note (Signed)
Packing removed, no drainage.  Still some keritinous material.  Redressed.

## 2020-02-26 ENCOUNTER — Ambulatory Visit (INDEPENDENT_AMBULATORY_CARE_PROVIDER_SITE_OTHER): Payer: Medicare Other | Admitting: Legal Medicine

## 2020-02-26 ENCOUNTER — Encounter: Payer: Self-pay | Admitting: Legal Medicine

## 2020-02-26 ENCOUNTER — Other Ambulatory Visit: Payer: Self-pay

## 2020-02-26 VITALS — BP 140/86 | HR 74 | Temp 97.5°F | Resp 16 | Ht 65.0 in | Wt 221.0 lb

## 2020-02-26 DIAGNOSIS — L02414 Cutaneous abscess of left upper limb: Secondary | ICD-10-CM

## 2020-02-26 DIAGNOSIS — M71012 Abscess of bursa, left shoulder: Secondary | ICD-10-CM

## 2020-02-26 NOTE — Progress Notes (Signed)
Acute Office Visit  Subjective:    Patient ID: Victoria Barajas, female    DOB: [birth year], 74 y.o.   MRN: [id number]  Chief Complaint  Patient presents with  . Abcess of bursa of left shoulder    HPI Patient is in today for Patient has tender area with swelling and redness left shoulder.  It is draining purulent material.  We got informed consent to I & D abscess.  02/19/20 patient returns for packing removal.  Less drainage and less pain.  02/26/2020 The abscess is healing well, still some drainage.  No keratinaceous particles.  She has another cyst present on left shoulder.  No pain. Past Medical History:  Diagnosis Date  . Acid reflux   . GERD (gastroesophageal reflux disease)   . High cholesterol   . Hypertension   . Migraine     Past Surgical History:  Procedure Laterality Date  . ABDOMINAL HYSTERECTOMY    . TUBAL LIGATION    . WRIST SURGERY      Family History  Problem Relation Age of Onset  . Heart Problems Mother   . Diabetes Father   . Diabetes Sister   . Kidney disease Sister   . Diabetes Brother   . Alzheimer's disease Brother   . Heart attack Brother     Social History   Socioeconomic History  . Marital status: Married    Spouse name: Not on file  . Number of children: Not on file  . Years of education: Not on file  . Highest education level: Not on file  Occupational History  . Not on file  Tobacco Use  . Smoking status: Former Smoker    Types: Cigarettes    Quit date: 1993    Years since quitting: 28.3  . Smokeless tobacco: Never Used  Substance and Sexual Activity  . Alcohol use: No  . Drug use: No  . Sexual activity: Not Currently  Other Topics Concern  . Not on file  Social History Narrative  . Not on file   Social Determinants of Health   Financial Resource Strain:   . Difficulty of Paying Living Expenses:   Food Insecurity:   . Worried About Programme researcher, broadcasting/film/video in the Last Year:   . Barista in the Last Year:     Transportation Needs:   . Freight forwarder (Medical):   Marland Kitchen Lack of Transportation (Non-Medical):   Physical Activity:   . Days of Exercise per Week:   . Minutes of Exercise per Session:   Stress:   . Feeling of Stress :   Social Connections:   . Frequency of Communication with Friends and Family:   . Frequency of Social Gatherings with Friends and Family:   . Attends Religious Services:   . Active Member of Clubs or Organizations:   . Attends Banker Meetings:   Marland Kitchen Marital Status:   Intimate Partner Violence:   . Fear of Current or Ex-Partner:   . Emotionally Abused:   Marland Kitchen Physically Abused:   . Sexually Abused:     Outpatient Medications Prior to Visit  Medication Sig Dispense Refill  . aspirin-acetaminophen-caffeine (EXCEDRIN MIGRAINE) 250-250-65 MG tablet Take 1 tablet by mouth every 6 (six) hours as needed.    Marland Kitchen atorvastatin (LIPITOR) 40 MG tablet Take 1 tablet by mouth daily.  0  . lisinopril (PRINIVIL,ZESTRIL) 20 MG tablet Take 1 tablet by mouth daily.    . naproxen sodium (ALEVE) 220 MG tablet  Take 220 mg by mouth daily as needed.     . nitroGLYCERIN (NITROSTAT) 0.4 MG SL tablet Place 1 tablet (0.4 mg total) under the tongue every 5 (five) minutes as needed. 25 tablet 11  . omeprazole (PRILOSEC) 40 MG capsule TAKE 1 CAPSULE BY MOUTH EVERY NIGHT AT BEDTIME 90 capsule 2  . polyethylene glycol (MIRALAX / GLYCOLAX) packet Take 17 g by mouth daily as needed.     No facility-administered medications prior to visit.    Allergies  Allergen Reactions  . Tape Rash    SOME ADHESIVES LEAVE RASH, RED AREA    Review of Systems  Constitutional: Negative.   HENT: Negative.   Respiratory: Negative.   Cardiovascular: Negative.   Gastrointestinal: Negative.   Endocrine: Negative.   Musculoskeletal: Negative.   Skin:       Abscess left shoulder  Psychiatric/Behavioral: Negative.        Objective:    Physical Exam  Constitutional: She is well-developed,  well-nourished, and in no distress.  Cardiovascular: Normal rate, regular rhythm, normal heart sounds and intact distal pulses.  Pulmonary/Chest: Effort normal and breath sounds normal.  Skin:  The wound is open but no infection, it is granulation in well.  She has a another small cyst on shoulder . Vitals reviewed.  BP 124/78   Pulse 78   Temp (!) 97.4 F (36.3 C)   Resp 16   Ht 5\' 5"  (1.651 m)   Wt 220 lb 9.6 oz (100.1 kg)   SpO2 99%   BMI 36.71 kg/m  Wt Readings from Last 3 Encounters:  02/19/20 220 lb 9.6 oz (100.1 kg)  02/17/20 221 lb (100.2 kg)  11/13/19 223 lb (101.2 kg)    Health Maintenance Due  Topic Date Due  . Hepatitis C Screening  Never done  . COVID-19 Vaccine (1) Never done  . COLONOSCOPY  Never done  . DEXA SCAN  Never done    There are no preventive care reminders to display for this patient.   No results found for: TSH Lab Results  Component Value Date   WBC 6.4 11/13/2019   HGB 13.9 11/13/2019   HCT 41.4 11/13/2019   MCV 88 11/13/2019   PLT 226 11/13/2019   Lab Results  Component Value Date   NA 143 11/13/2019   K 5.0 11/13/2019   CO2 21 11/13/2019   GLUCOSE 100 (H) 11/13/2019   BUN 19 11/13/2019   CREATININE 0.93 11/13/2019   BILITOT 1.3 (H) 11/13/2019   ALKPHOS 85 11/13/2019   AST 17 11/13/2019   ALT 18 11/13/2019   PROT 6.4 11/13/2019   ALBUMIN 4.5 11/13/2019   CALCIUM 10.1 11/13/2019   ANIONGAP 14 11/08/2017   Lab Results  Component Value Date   CHOL 143 11/13/2019   Lab Results  Component Value Date   HDL 44 11/13/2019   Lab Results  Component Value Date   LDLCALC 75 11/13/2019   Lab Results  Component Value Date   TRIG 133 11/13/2019   Lab Results  Component Value Date   CHOLHDL 3.3 11/13/2019   No results found for: HGBA1C     Assessment & Plan:   Problem List Items Addressed This Visit      Musculoskeletal and Integument   Abscess of skin of left shoulder    Packing removed, no drainage.  Still some  keritinous material.  Redressed.       Other Visit Diagnoses    Abscess of bursa of left shoulder    -  Primary       Return in about 1 week (around 02/26/2020) for I & D follow up.     No orders of the defined types were placed in this encounter.    Brent Bulla, MD

## 2020-02-28 NOTE — Assessment & Plan Note (Signed)
It is clean and healing well good granulation.  Follow up if needed

## 2020-03-11 ENCOUNTER — Ambulatory Visit (INDEPENDENT_AMBULATORY_CARE_PROVIDER_SITE_OTHER): Payer: Medicare Other | Admitting: Legal Medicine

## 2020-03-11 ENCOUNTER — Other Ambulatory Visit: Payer: Self-pay

## 2020-03-11 ENCOUNTER — Encounter: Payer: Self-pay | Admitting: Legal Medicine

## 2020-03-11 VITALS — BP 108/78 | HR 66 | Temp 97.1°F | Resp 16 | Ht 65.0 in | Wt 220.6 lb

## 2020-03-11 DIAGNOSIS — E782 Mixed hyperlipidemia: Secondary | ICD-10-CM

## 2020-03-11 DIAGNOSIS — Z6836 Body mass index (BMI) 36.0-36.9, adult: Secondary | ICD-10-CM

## 2020-03-11 DIAGNOSIS — Z6835 Body mass index (BMI) 35.0-35.9, adult: Secondary | ICD-10-CM | POA: Insufficient documentation

## 2020-03-11 DIAGNOSIS — K219 Gastro-esophageal reflux disease without esophagitis: Secondary | ICD-10-CM

## 2020-03-11 DIAGNOSIS — I10 Essential (primary) hypertension: Secondary | ICD-10-CM | POA: Diagnosis not present

## 2020-03-11 DIAGNOSIS — I517 Cardiomegaly: Secondary | ICD-10-CM | POA: Diagnosis not present

## 2020-03-11 HISTORY — DX: Body mass index (BMI) 36.0-36.9, adult: Z68.36

## 2020-03-11 HISTORY — DX: Cardiomegaly: I51.7

## 2020-03-11 NOTE — Patient Instructions (Signed)

## 2020-03-11 NOTE — Progress Notes (Signed)
Established Patient Office Visit  Subjective:  Patient ID: Victoria Barajas, female    DOB: [birth year]  Age: 74 y.o. MRN: [id number]  CC:  Chief Complaint  Patient presents with  . Hyperlipidemia  . Hypertension  . Gastroesophageal Reflux    HPI Victoria Barajas presents for Chronic visit  Patient presents for follow up of hypertension.  Patient tolerating lisinopril well with side effects.  Patient was diagnosed with hypertension 2010 so has been treated for hypertension for 10 years.Patient is working on maintaining diet and exercise regimen and follows up as directed. Complication include LVH.  Patient presents with hyperlipidemia.  Compliance with treatment has been good; patient takes medicines as directed, maintains low cholesterol diet, follows up as directed, and maintains exercise regimen.  Patient is using atorvastatin without problems.  Patient has gastroesophageal reflux symptoms withesophagitis and LTRD.  The symptoms are moderate intensity.  Length of symptoms 10 years.  Medicines include omeprazol.  Complications include none.  She has tried to stop but had a severe recurrance..  Past Medical History:  Diagnosis Date  . Acid reflux   . High cholesterol   . Migraine     Past Surgical History:  Procedure Laterality Date  . ABDOMINAL HYSTERECTOMY    . TUBAL LIGATION    . WRIST SURGERY      Family History  Problem Relation Age of Onset  . Heart Problems Mother   . Diabetes Father   . Diabetes Sister   . Kidney disease Sister   . Diabetes Brother   . Alzheimer's disease Brother   . Heart attack Brother     Social History   Socioeconomic History  . Marital status: Married    Spouse name: Not on file  . Number of children: Not on file  . Years of education: Not on file  . Highest education level: Not on file  Occupational History  . Not on file  Tobacco Use  . Smoking status: Former Smoker    Types: Cigarettes    Quit date: 1993    Years since  quitting: 28.4  . Smokeless tobacco: Never Used  Substance and Sexual Activity  . Alcohol use: No  . Drug use: No  . Sexual activity: Not Currently  Other Topics Concern  . Not on file  Social History Narrative  . Not on file   Social Determinants of Health   Financial Resource Strain:   . Difficulty of Paying Living Expenses:   Food Insecurity:   . Worried About Programme researcher, broadcasting/film/video in the Last Year:   . Barista in the Last Year:   Transportation Needs:   . Freight forwarder (Medical):   Marland Kitchen Lack of Transportation (Non-Medical):   Physical Activity:   . Days of Exercise per Week:   . Minutes of Exercise per Session:   Stress:   . Feeling of Stress :   Social Connections:   . Frequency of Communication with Friends and Family:   . Frequency of Social Gatherings with Friends and Family:   . Attends Religious Services:   . Active Member of Clubs or Organizations:   . Attends Banker Meetings:   Marland Kitchen Marital Status:   Intimate Partner Violence:   . Fear of Current or Ex-Partner:   . Emotionally Abused:   Marland Kitchen Physically Abused:   . Sexually Abused:     Outpatient Medications Prior to Visit  Medication Sig Dispense Refill  . aspirin-acetaminophen-caffeine (EXCEDRIN MIGRAINE) 250-250-65 MG  tablet Take 1 tablet by mouth every 6 (six) hours as needed.    Marland Kitchen atorvastatin (LIPITOR) 40 MG tablet Take 1 tablet by mouth daily.  0  . lisinopril (PRINIVIL,ZESTRIL) 20 MG tablet Take 1 tablet by mouth daily.    . naproxen sodium (ALEVE) 220 MG tablet Take 220 mg by mouth daily as needed.     Marland Kitchen omeprazole (PRILOSEC) 40 MG capsule TAKE 1 CAPSULE BY MOUTH EVERY NIGHT AT BEDTIME 90 capsule 2  . polyethylene glycol (MIRALAX / GLYCOLAX) packet Take 17 g by mouth daily as needed.    . nitroGLYCERIN (NITROSTAT) 0.4 MG SL tablet Place 1 tablet (0.4 mg total) under the tongue every 5 (five) minutes as needed. 25 tablet 11   No facility-administered medications prior to visit.     Allergies  Allergen Reactions  . Prolia [Denosumab] Other (See Comments)    Severe myalgia  . Alendronate Other (See Comments)    Severe fatigue and aching  . Tape Rash    SOME ADHESIVES LEAVE RASH, RED AREA    ROS Review of Systems  Constitutional: Negative.   HENT: Negative.   Eyes: Negative.   Cardiovascular: Negative.   Gastrointestinal: Negative.   Endocrine: Negative.   Genitourinary: Negative.   Musculoskeletal: Negative.   Skin: Negative.   Neurological: Negative.   Psychiatric/Behavioral: Negative.       Objective:    Physical Exam  Constitutional: She appears well-developed and well-nourished.  HENT:  Head: Normocephalic and atraumatic.  Right Ear: External ear normal.  Left Ear: External ear normal.  Nose: Nose normal.  Mouth/Throat: Oropharynx is clear and moist.  Eyes: Pupils are equal, round, and reactive to light. Conjunctivae and EOM are normal.  Cardiovascular: Normal rate, regular rhythm, normal heart sounds and intact distal pulses.  Pulmonary/Chest: Effort normal and breath sounds normal.  Abdominal: Soft. Bowel sounds are normal.  Musculoskeletal:     Cervical back: Normal range of motion and neck supple.  Vitals reviewed.   BP 108/78   Pulse 66   Temp (!) 97.1 F (36.2 C)   Resp 16   Ht 5\' 5"  (1.651 m)   Wt 220 lb 9.6 oz (100.1 kg)   SpO2 99%   BMI 36.71 kg/m  Wt Readings from Last 3 Encounters:  03/11/20 220 lb 9.6 oz (100.1 kg)  02/26/20 221 lb (100.2 kg)  02/19/20 220 lb 9.6 oz (100.1 kg)     Health Maintenance Due  Topic Date Due  . Hepatitis C Screening  Never done  . COVID-19 Vaccine (1) Never done  . COLONOSCOPY  Never done  . DEXA SCAN  Never done    There are no preventive care reminders to display for this patient.  No results found for: TSH Lab Results  Component Value Date   WBC 6.4 11/13/2019   HGB 13.9 11/13/2019   HCT 41.4 11/13/2019   MCV 88 11/13/2019   PLT 226 11/13/2019   Lab Results   Component Value Date   NA 143 11/13/2019   K 5.0 11/13/2019   CO2 21 11/13/2019   GLUCOSE 100 (H) 11/13/2019   BUN 19 11/13/2019   CREATININE 0.93 11/13/2019   BILITOT 1.3 (H) 11/13/2019   ALKPHOS 85 11/13/2019   AST 17 11/13/2019   ALT 18 11/13/2019   PROT 6.4 11/13/2019   ALBUMIN 4.5 11/13/2019   CALCIUM 10.1 11/13/2019   ANIONGAP 14 11/08/2017   Lab Results  Component Value Date   CHOL 143 11/13/2019  Lab Results  Component Value Date   HDL 44 11/13/2019   Lab Results  Component Value Date   LDLCALC 75 11/13/2019   Lab Results  Component Value Date   TRIG 133 11/13/2019   Lab Results  Component Value Date   CHOLHDL 3.3 11/13/2019   No results found for: HGBA1C    Assessment & Plan:  Hypertension: An individual hypertension care plan was established and reinforced today.  The patient's status was assessed using clinical findings on exam and labs or diagnostic tests. The patient's success at meeting treatment goals on disease specific evidence-based guidelines and found to be well controlled. SELF MANAGEMENT: The patient and I together assessed ways to personally work towards obtaining the recommended goals. RECOMMENDATIONS: avoid decongestants found in common cold remedies, decrease consumption of alcohol, perform routine monitoring of BP with home BP cuff, exercise, reduction of dietary salt, take medicines as prescribed, try not to miss doses and quit smoking.  Regular exercise and maintaining a healthy weight is needed.  Stress reduction may help. A CLINICAL SUMMARY including written plan identify barriers to care unique to individual due to social or financial issues.  We attempt to mutually creat solutions for individual and family understanding.  GERD: Plan of care was formulated today.  She is doing well.  A plan of care was formulated using patient exam, tests and other sources to optimize care using evidence based information.  Recommend no smoking, no  eating after supper, avoid fatty foods, elevate Head of bed, avoid tight fitting clothing.  Continue on omeprazole  Osteoporosis: She has done poorly with osteoporosis medicines- see allergies.  She refuses more medicines..  Hyperlipidemia: AN INDIVIDUAL CARE PLAN for hyperlipidemia/ cholesterol was established and reinforced today.  The patient's status was assessed using clinical findings on exam, lab and other diagnostic tests. The patient's disease status was assessed based on evidence-based guidelines and found to be well controlled. MEDICATIONS were reviewed. SELF MANAGEMENT GOALS have been discussed and patient's success at attaining the goal of low cholesterol was assessed. RECOMMENDATION given include regular exercise 3 days a week and low cholesterol/low fat diet. CLINICAL SUMMARY including written plan to identify barriers unique to the patient due to social or economic  reasons was discussed.  BMI 36: An individualize plan was formulated for obesity using patient history and physical exam to encourage weight loss.  An evidence based program was formulated.  Patient is to cut portion size with meals and to plan physical exercise 3 days a week at least 20 minutes.  Weight watchers and other programs are helpful.  Planned amount of weight loss 10 lbs. With hypertension and hypercholesterolemia with heart disease, she meets criteria for morbid obesity.  Morbid obesity: An individualize plan was formulated for obesityusing patient history and physical exam to encourage weight loss.  An evidence based program was formulated.  Patient is to cut portion size with meals and to plan physical exercise 3 days a week at least 20 minutes.  Weight watchers and other programs are helpful.  Planned amount of weight loss 10 lbs.   Follow-up: Return in about 6 months (around 09/10/2020) for fasting.    Reinaldo Meeker, MD

## 2020-03-12 LAB — COMPREHENSIVE METABOLIC PANEL
ALT: 17 IU/L (ref 0–32)
AST: 18 IU/L (ref 0–40)
Albumin/Globulin Ratio: 2.8 — ABNORMAL HIGH (ref 1.2–2.2)
Albumin: 4.7 g/dL (ref 3.7–4.7)
Alkaline Phosphatase: 99 IU/L (ref 48–121)
BUN/Creatinine Ratio: 17 (ref 12–28)
BUN: 16 mg/dL (ref 8–27)
Bilirubin Total: 1.1 mg/dL (ref 0.0–1.2)
CO2: 24 mmol/L (ref 20–29)
Calcium: 10 mg/dL (ref 8.7–10.3)
Chloride: 106 mmol/L (ref 96–106)
Creatinine, Ser: 0.96 mg/dL (ref 0.57–1.00)
GFR calc Af Amer: 68 mL/min/{1.73_m2} (ref >59)
GFR calc non Af Amer: 59 mL/min/{1.73_m2} — ABNORMAL LOW (ref >59)
Globulin, Total: 1.7 g/dL (ref 1.5–4.5)
Glucose: 101 mg/dL — ABNORMAL HIGH (ref 65–99)
Potassium: 4.7 mmol/L (ref 3.5–5.2)
Sodium: 143 mmol/L (ref 134–144)
Total Protein: 6.4 g/dL (ref 6.0–8.5)

## 2020-03-12 LAB — CBC WITH DIFFERENTIAL/PLATELET
Basophils Absolute: 0 10*3/uL (ref 0.0–0.2)
Basos: 1 %
EOS (ABSOLUTE): 0.1 10*3/uL (ref 0.0–0.4)
Eos: 2 %
Hematocrit: 41.7 % (ref 34.0–46.6)
Hemoglobin: 13.7 g/dL (ref 11.1–15.9)
Immature Grans (Abs): 0 10*3/uL (ref 0.0–0.1)
Immature Granulocytes: 0 %
Lymphocytes Absolute: 1.3 10*3/uL (ref 0.7–3.1)
Lymphs: 22 %
MCH: 29.8 pg (ref 26.6–33.0)
MCHC: 32.9 g/dL (ref 31.5–35.7)
MCV: 91 fL (ref 79–97)
Monocytes Absolute: 0.5 10*3/uL (ref 0.1–0.9)
Monocytes: 8 %
Neutrophils Absolute: 4 10*3/uL (ref 1.4–7.0)
Neutrophils: 67 %
Platelets: 238 10*3/uL (ref 150–450)
RBC: 4.6 x10E6/uL (ref 3.77–5.28)
RDW: 12.3 % (ref 11.7–15.4)
WBC: 6 10*3/uL (ref 3.4–10.8)

## 2020-03-12 LAB — LIPID PANEL
Chol/HDL Ratio: 3 ratio (ref 0.0–4.4)
Cholesterol, Total: 125 mg/dL (ref 100–199)
HDL: 42 mg/dL (ref >39)
LDL Chol Calc (NIH): 64 mg/dL (ref 0–99)
Triglycerides: 99 mg/dL (ref 0–149)
VLDL Cholesterol Cal: 19 mg/dL (ref 5–40)

## 2020-03-12 LAB — CARDIOVASCULAR RISK ASSESSMENT

## 2020-03-13 NOTE — Progress Notes (Signed)
CBC normal, glucose 101, kidneys slightly low, Liver tests normal, cholesterol normal, lp

## 2020-08-19 ENCOUNTER — Telehealth: Payer: Self-pay | Admitting: Legal Medicine

## 2020-08-19 NOTE — Chronic Care Management (AMB) (Signed)
  Chronic Care Management   Note  08/19/2020 Name: Victoria Barajas MRN: [id number] DOB: [birth year]  Victoria Barajas is a 74 y.o. year old female who is a primary care patient of Abigail Miyamoto, MD. I reached out to Maurine Simmering by phone today in response to a referral sent by Ms. Victoria Barajas's PCP, Abigail Miyamoto, MD.   Victoria Barajas was given information about Chronic Care Management services today including:  1. CCM service includes personalized support from designated clinical staff supervised by her physician, including individualized plan of care and coordination with other care providers 2. 24/7 contact phone numbers for assistance for urgent and routine care needs. 3. Service will only be billed when office clinical staff spend 20 minutes or more in a month to coordinate care. 4. Only one practitioner may furnish and bill the service in a calendar month. 5. The patient may stop CCM services at any time (effective at the end of the month) by phone call to the office staff.   Patient agreed to services and verbal consent obtained.   Follow up plan:   Aggie Hacker  Upstream Scheduler

## 2020-08-30 ENCOUNTER — Other Ambulatory Visit: Payer: Self-pay | Admitting: Legal Medicine

## 2020-08-31 ENCOUNTER — Ambulatory Visit: Payer: Medicare Other | Admitting: Legal Medicine

## 2020-09-08 ENCOUNTER — Encounter: Payer: Self-pay | Admitting: Legal Medicine

## 2020-09-09 ENCOUNTER — Ambulatory Visit (INDEPENDENT_AMBULATORY_CARE_PROVIDER_SITE_OTHER): Payer: Medicare Other | Admitting: Legal Medicine

## 2020-09-09 ENCOUNTER — Other Ambulatory Visit: Payer: Self-pay

## 2020-09-09 ENCOUNTER — Encounter: Payer: Self-pay | Admitting: Legal Medicine

## 2020-09-09 VITALS — BP 130/80 | HR 76 | Temp 97.6°F | Ht 66.0 in | Wt 218.0 lb

## 2020-09-09 DIAGNOSIS — K219 Gastro-esophageal reflux disease without esophagitis: Secondary | ICD-10-CM

## 2020-09-09 DIAGNOSIS — E782 Mixed hyperlipidemia: Secondary | ICD-10-CM | POA: Diagnosis not present

## 2020-09-09 DIAGNOSIS — I10 Essential (primary) hypertension: Secondary | ICD-10-CM

## 2020-09-09 DIAGNOSIS — I517 Cardiomegaly: Secondary | ICD-10-CM

## 2020-09-09 DIAGNOSIS — E6609 Other obesity due to excess calories: Secondary | ICD-10-CM | POA: Insufficient documentation

## 2020-09-09 DIAGNOSIS — Z6836 Body mass index (BMI) 36.0-36.9, adult: Secondary | ICD-10-CM

## 2020-09-09 HISTORY — DX: Morbid (severe) obesity due to excess calories: E66.01

## 2020-09-09 NOTE — Progress Notes (Signed)
Subjective:  Patient ID: Victoria Barajas, female    DOB: [birth year]  Age: 74 y.o. MRN: [id number]  Chief Complaint  Patient presents with  . Hyperlipidemia  . Hypertension    HPI: Chronic visit  Patient presents for follow up of hypertension.  Patient tolerating lisinopril well with side effects.  Patient was diagnosed with hypertension 2010 so has been treated for hypertension for 10 years.Patient is working on maintaining diet and exercise regimen and follows up as directed. Complication include none.  Patient presents with hyperlipidemia.  Compliance with treatment has been good; patient takes medicines as directed, maintains low cholesterol diet, follows up as directed, and maintains exercise regimen.  Patient is using aorvastatin without problems.  CORONARY ARTERY DISEASE  Patient presents in follow up of CAD. Patient was diagnosed in 2020. The patient has no associated CHF. The patient is currently taking a beta blocker, statin, and aspirin. CAD was diagnosed 2 years ago.  Patient is having some angina. Patient has used some ntg NTG.  Patient is followed by cardiology.  Patient had no procedures . Last angiography was na, last echocardiogram na.   Current Outpatient Medications on File Prior to Visit  Medication Sig Dispense Refill  . aspirin-acetaminophen-caffeine (EXCEDRIN MIGRAINE) 250-250-65 MG tablet Take 1 tablet by mouth every 6 (six) hours as needed.    Marland Kitchen atorvastatin (LIPITOR) 40 MG tablet TAKE 1 TABLET BY MOUTH EVERY DAY (CAN USE WITH CO*Q-10) 90 tablet 2  . lisinopril (PRINIVIL,ZESTRIL) 20 MG tablet Take 1 tablet by mouth daily.    . naproxen sodium (ALEVE) 220 MG tablet Take 220 mg by mouth daily as needed.     . nitroGLYCERIN (NITROSTAT) 0.4 MG SL tablet Place 1 tablet (0.4 mg total) under the tongue every 5 (five) minutes as needed. 25 tablet 11  . omeprazole (PRILOSEC) 40 MG capsule TAKE 1 CAPSULE BY MOUTH EVERY NIGHT AT BEDTIME 90 capsule 2  . polyethylene glycol  (MIRALAX / GLYCOLAX) packet Take 17 g by mouth daily as needed.     No current facility-administered medications on file prior to visit.   Past Medical History:  Diagnosis Date  . Acid reflux   . High cholesterol   . Migraine    Past Surgical History:  Procedure Laterality Date  . ABDOMINAL HYSTERECTOMY    . TUBAL LIGATION    . WRIST SURGERY      Family History  Problem Relation Age of Onset  . Heart Problems Mother   . Diabetes Father   . Diabetes Sister   . Kidney disease Sister   . Diabetes Brother   . Alzheimer's disease Brother   . Heart attack Brother    Social History   Socioeconomic History  . Marital status: Married    Spouse name: Not on file  . Number of children: Not on file  . Years of education: Not on file  . Highest education level: Not on file  Occupational History  . Not on file  Tobacco Use  . Smoking status: Former Smoker    Types: Cigarettes    Quit date: 1993    Years since quitting: 28.9  . Smokeless tobacco: Never Used  Substance and Sexual Activity  . Alcohol use: No  . Drug use: No  . Sexual activity: Not Currently  Other Topics Concern  . Not on file  Social History Narrative  . Not on file   Social Determinants of Health   Financial Resource Strain:   . Difficulty of Paying  Living Expenses: Not on file  Food Insecurity:   . Worried About Programme researcher, broadcasting/film/video in the Last Year: Not on file  . Ran Out of Food in the Last Year: Not on file  Transportation Needs:   . Lack of Transportation (Medical): Not on file  . Lack of Transportation (Non-Medical): Not on file  Physical Activity:   . Days of Exercise per Week: Not on file  . Minutes of Exercise per Session: Not on file  Stress:   . Feeling of Stress : Not on file  Social Connections:   . Frequency of Communication with Friends and Family: Not on file  . Frequency of Social Gatherings with Friends and Family: Not on file  . Attends Religious Services: Not on file  . Active  Member of Clubs or Organizations: Not on file  . Attends Banker Meetings: Not on file  . Marital Status: Not on file    Review of Systems  Constitutional: Negative for chills, fatigue and fever.  HENT: Positive for congestion. Negative for ear pain, rhinorrhea and sore throat.   Respiratory: Negative for cough and shortness of breath.   Cardiovascular: Negative for chest pain (sharp pain).  Gastrointestinal: Negative for abdominal pain, constipation, diarrhea, nausea and vomiting.  Genitourinary: Negative for dysuria and urgency.  Musculoskeletal: Negative for back pain and myalgias.  Neurological: Negative for dizziness, weakness, light-headedness and headaches.  Psychiatric/Behavioral: Negative for dysphoric mood. The patient is not nervous/anxious.      Objective:  BP 130/80   Pulse 76   Temp 97.6 F (36.4 C)   Ht 5\' 6"  (1.676 m)   Wt 218 lb (98.9 kg)   SpO2 98%   BMI 35.19 kg/m   BP/Weight 09/09/2020 03/11/2020 02/26/2020  Systolic BP 130 108 140  Diastolic BP 80 78 86  Wt. (Lbs) 218 220.6 221  BMI 35.19 36.71 36.78    Physical Exam Vitals reviewed.  Constitutional:      Appearance: Normal appearance.  HENT:     Head: Normocephalic.     Right Ear: Tympanic membrane normal.     Left Ear: Tympanic membrane normal.     Mouth/Throat:     Mouth: Mucous membranes are moist.     Pharynx: Oropharynx is clear.  Eyes:     Extraocular Movements: Extraocular movements intact.     Conjunctiva/sclera: Conjunctivae normal.     Pupils: Pupils are equal, round, and reactive to light.  Cardiovascular:     Rate and Rhythm: Normal rate and regular rhythm.     Pulses: Normal pulses.     Heart sounds: Normal heart sounds.  Pulmonary:     Effort: Pulmonary effort is normal.     Breath sounds: Normal breath sounds.  Abdominal:     General: Abdomen is flat. Bowel sounds are normal.  Musculoskeletal:        General: Tenderness (spine) present.     Cervical back:  Normal range of motion and neck supple.  Skin:    General: Skin is warm.     Capillary Refill: Capillary refill takes less than 2 seconds.  Neurological:     General: No focal deficit present.     Mental Status: She is alert and oriented to person, place, and time. Mental status is at baseline.     Motor: No weakness.     Gait: Gait normal.  Psychiatric:        Mood and Affect: Mood normal.  Thought Content: Thought content normal.        Judgment: Judgment normal.       Lab Results  Component Value Date   WBC 6.0 03/11/2020   HGB 13.7 03/11/2020   HCT 41.7 03/11/2020   PLT 238 03/11/2020   GLUCOSE 101 (H) 03/11/2020   CHOL 125 03/11/2020   TRIG 99 03/11/2020   HDL 42 03/11/2020   LDLCALC 64 03/11/2020   ALT 17 03/11/2020   AST 18 03/11/2020   NA 143 03/11/2020   K 4.7 03/11/2020   CL 106 03/11/2020   CREATININE 0.96 03/11/2020   BUN 16 03/11/2020   CO2 24 03/11/2020   INR 0.95 11/08/2017      Assessment & Plan:   1. Mixed hyperlipidemia - Lipid panel - TSH AN INDIVIDUAL CARE PLAN for hyperlipidemia/ cholesterol was established and reinforced today.  The patient's status was assessed using clinical findings on exam, lab and other diagnostic tests. The patient's disease status was assessed based on evidence-based guidelines and found to be well controlled. MEDICATIONS were reviewed. SELF MANAGEMENT GOALS have been discussed and patient's success at attaining the goal of low cholesterol was assessed. RECOMMENDATION given include regular exercise 3 days a week and low cholesterol/low fat diet. CLINICAL SUMMARY including written plan to identify barriers unique to the patient due to social or economic  reasons was discussed.  2. Essential hypertension - CBC with Differential/Platelet - Comprehensive metabolic panel An individual hypertension care plan was established and reinforced today.  The patient's status was assessed using clinical findings on exam and  labs or diagnostic tests. The patient's success at meeting treatment goals on disease specific evidence-based guidelines and found to be well controlled. SELF MANAGEMENT: The patient and I together assessed ways to personally work towards obtaining the recommended goals. RECOMMENDATIONS: avoid decongestants found in common cold remedies, decrease consumption of alcohol, perform routine monitoring of BP with home BP cuff, exercise, reduction of dietary salt, take medicines as prescribed, try not to miss doses and quit smoking.  Regular exercise and maintaining a healthy weight is needed.  Stress reduction may help. A CLINICAL SUMMARY including written plan identify barriers to care unique to individual due to social or financial issues.  We attempt to mutually creat solutions for individual and family understanding.  3. Gastroesophageal reflux disease without esophagitis Plan of care was formulated today.  She is doing well.  A plan of care was formulated using patient exam, tests and other sources to optimize care using evidence based information.  Recommend no smoking, no eating after supper, avoid fatty foods, elevate Head of bed, avoid tight fitting clothing.  Continue on OTC.  4. LVH (left ventricular hypertrophy) AN INDIVIDUAL CARE PLAN for cardiomyopathy was established and reinforced today.  The patient's status was assessed using clinical findings on exam, labs, and other diagnostic testing. Patient's success at meeting treatment goals based on disease specific evidence-bassed guidelines and found to be in fair control. RECOMMENDATIONS include maintaining present medicines and treatment.Review of Cardiology records show no CAD with Lexiscan. Patient is using NTG PRN and I am referring back to cardiology, last seen 2019.  Control risk factors.      Orders Placed This Encounter  Procedures  . CBC with Differential/Platelet  . Comprehensive metabolic panel  . Lipid panel  . TSH      I  spent 30 minutes dedicated to the care of this patient on the date of this encounter to include face-to-face time with the patient,  as well as: discus  I reviewed old records in [facility] database also.sing possible new angina with use of NTG and need to see cardiology, may now have active CAD.  Follow-up: Return in about 3 months (around 12/08/2020).  An After Visit Summary was printed and given to the patient.  Brent Bulla, MD [facility] [phone]

## 2020-09-10 LAB — CBC WITH DIFFERENTIAL/PLATELET
Basophils Absolute: 0.1 10*3/uL (ref 0.0–0.2)
Basos: 1 %
EOS (ABSOLUTE): 0.1 10*3/uL (ref 0.0–0.4)
Eos: 1 %
Hematocrit: 41.9 % (ref 34.0–46.6)
Hemoglobin: 13.9 g/dL (ref 11.1–15.9)
Immature Grans (Abs): 0 10*3/uL (ref 0.0–0.1)
Immature Granulocytes: 0 %
Lymphocytes Absolute: 1.3 10*3/uL (ref 0.7–3.1)
Lymphs: 21 %
MCH: 29.8 pg (ref 26.6–33.0)
MCHC: 33.2 g/dL (ref 31.5–35.7)
MCV: 90 fL (ref 79–97)
Monocytes Absolute: 0.5 10*3/uL (ref 0.1–0.9)
Monocytes: 8 %
Neutrophils Absolute: 4.5 10*3/uL (ref 1.4–7.0)
Neutrophils: 69 %
Platelets: 236 10*3/uL (ref 150–450)
RBC: 4.67 x10E6/uL (ref 3.77–5.28)
RDW: 12.2 % (ref 11.7–15.4)
WBC: 6.5 10*3/uL (ref 3.4–10.8)

## 2020-09-10 LAB — COMPREHENSIVE METABOLIC PANEL
ALT: 24 IU/L (ref 0–32)
AST: 20 IU/L (ref 0–40)
Albumin/Globulin Ratio: 2.6 — ABNORMAL HIGH (ref 1.2–2.2)
Albumin: 4.7 g/dL (ref 3.7–4.7)
Alkaline Phosphatase: 91 IU/L (ref 44–121)
BUN/Creatinine Ratio: 22 (ref 12–28)
BUN: 21 mg/dL (ref 8–27)
Bilirubin Total: 1.4 mg/dL — ABNORMAL HIGH (ref 0.0–1.2)
CO2: 22 mmol/L (ref 20–29)
Calcium: 9.9 mg/dL (ref 8.7–10.3)
Chloride: 105 mmol/L (ref 96–106)
Creatinine, Ser: 0.94 mg/dL (ref 0.57–1.00)
GFR calc Af Amer: 70 mL/min/{1.73_m2} (ref >59)
GFR calc non Af Amer: 60 mL/min/{1.73_m2} (ref >59)
Globulin, Total: 1.8 g/dL (ref 1.5–4.5)
Glucose: 104 mg/dL — ABNORMAL HIGH (ref 65–99)
Potassium: 4.7 mmol/L (ref 3.5–5.2)
Sodium: 141 mmol/L (ref 134–144)
Total Protein: 6.5 g/dL (ref 6.0–8.5)

## 2020-09-10 LAB — LIPID PANEL
Chol/HDL Ratio: 3.7 ratio (ref 0.0–4.4)
Cholesterol, Total: 160 mg/dL (ref 100–199)
HDL: 43 mg/dL (ref >39)
LDL Chol Calc (NIH): 94 mg/dL (ref 0–99)
Triglycerides: 126 mg/dL (ref 0–149)
VLDL Cholesterol Cal: 23 mg/dL (ref 5–40)

## 2020-09-10 LAB — TSH: TSH: 2.09 u[IU]/mL (ref 0.450–4.500)

## 2020-09-10 LAB — CARDIOVASCULAR RISK ASSESSMENT

## 2020-09-11 NOTE — Progress Notes (Signed)
CBC normal, glucose 104, kidney tests normal, bilirubin slightly up- consider ultrasound abdomen. , cholesterol OK,  lp

## 2020-09-16 NOTE — Progress Notes (Signed)
Patient not seen the results in My  Chart, call him with values lp

## 2020-09-29 DIAGNOSIS — M20001 Unspecified deformity of right finger(s): Secondary | ICD-10-CM | POA: Diagnosis not present

## 2020-10-03 DIAGNOSIS — H11153 Pinguecula, bilateral: Secondary | ICD-10-CM | POA: Diagnosis not present

## 2020-10-03 DIAGNOSIS — H02831 Dermatochalasis of right upper eyelid: Secondary | ICD-10-CM | POA: Diagnosis not present

## 2020-10-03 DIAGNOSIS — H02834 Dermatochalasis of left upper eyelid: Secondary | ICD-10-CM | POA: Diagnosis not present

## 2020-10-03 DIAGNOSIS — H2513 Age-related nuclear cataract, bilateral: Secondary | ICD-10-CM | POA: Diagnosis not present

## 2020-10-03 DIAGNOSIS — H5213 Myopia, bilateral: Secondary | ICD-10-CM | POA: Diagnosis not present

## 2020-10-10 ENCOUNTER — Other Ambulatory Visit: Payer: Self-pay | Admitting: Legal Medicine

## 2020-10-13 ENCOUNTER — Inpatient Hospital Stay: Payer: Medicare Other | Admitting: Legal Medicine

## 2020-10-14 ENCOUNTER — Other Ambulatory Visit: Payer: Self-pay | Admitting: Legal Medicine

## 2020-10-19 ENCOUNTER — Telehealth: Payer: Self-pay

## 2020-10-19 NOTE — Chronic Care Management (AMB) (Signed)
Chronic Care Management Pharmacy Assistant   Name: Victoria Barajas  MRN: [id number] DOB: 1946/04/15  Reason for Encounter: Initial questions for appointment with Victoria Barajas, CPP  PCP : Victoria Miyamoto, MD  Allergies:   Allergies  Allergen Reactions   Prolia [Denosumab] Other (See Comments)    Severe myalgia   Alendronate Other (See Comments)    Severe fatigue and aching   Tape Rash    SOME ADHESIVES LEAVE RASH, RED AREA    Medications: Outpatient Encounter Medications as of 10/19/2020  Medication Sig   aspirin-acetaminophen-caffeine (EXCEDRIN MIGRAINE) 250-250-65 MG tablet Take 1 tablet by mouth every 6 (six) hours as needed.   atorvastatin (LIPITOR) 40 MG tablet TAKE 1 TABLET BY MOUTH EVERY DAY (CAN USE WITH CO*Q-10)   lisinopril (PRINIVIL,ZESTRIL) 20 MG tablet Take 1 tablet by mouth daily.   naproxen sodium (ALEVE) 220 MG tablet Take 220 mg by mouth daily as needed.    nitroGLYCERIN (NITROSTAT) 0.4 MG SL tablet Place 1 tablet (0.4 mg total) under the tongue every 5 (five) minutes as needed.   omeprazole (PRILOSEC) 40 MG capsule TAKE 1 CAPSULE BY MOUTH EVERY NIGHT AT BEDTIME   polyethylene glycol (MIRALAX / GLYCOLAX) packet Take 17 g by mouth daily as needed.   No facility-administered encounter medications on file as of 10/19/2020.    Current Diagnosis: Patient Active Problem List   Diagnosis Date Noted   Morbid obesity (HCC) 09/09/2020   BMI 36.0-36.9,adult 03/11/2020   LVH (left ventricular hypertrophy) 03/11/2020   Abscess of skin of left shoulder 02/21/2020   Spondylosis with myelopathy, lumbar region 11/12/2019   Essential hypertension 11/25/2017   Rectocele 05/23/2016   Age-related nuclear cataract of both eyes 01/06/2016   Gastroesophageal reflux disease without esophagitis 01/06/2016   High risk medication use 01/06/2016   Migraine headache 01/06/2016   Mixed hyperlipidemia 01/06/2016   Myopia of both eyes 01/06/2016    Osteoporosis 01/06/2016   Plantar fasciitis, left 01/06/2016   Presbyopia of both eyes 01/06/2016   Have you seen any other providers since your last visit? Yes,  10/03/20 - ophthalmology   Any changes in your medications or health? Yes, has a ruptured tendon in her ring finger, and is going to see Hydrographic surveyor.  Any side effects from any medications? Patient reports no side effects with current medications.  She in the past has not been tolerant of osteoporosis medications.  Do you have an symptoms or problems not managed by your medications?  Patient stated she may have Celiac disease, she has bouts of vomiting and diarrhea, that come and go, she stated she will have stomach tightness and swelling and has trouble swallowing when this happens.  Her last noted episode was about 6 weeks ago.  The patient stated she also has episodes of vertigo.  Any concerns about your health right now? Yes, patient stated she has concerns about her hand and her stomach.  Has your provider asked that you check blood pressure, blood sugar, or follow special diet at home?  Patient stated she tries to watch her salt and sugar, but she loves to cook, so that is a challenge, she stated she loves bread.  Do you get any type of exercise on a regular basis? No, Patient stated she used to walk at the gym, but do to vertigo she was afraid of falling off treadmill, so for now she is only doing stuff around the house.  Can you think of a goal you would like  to reach for your health,  Yes, Patient stated she just wants to be as independent as possible for as long as possible.  Do you have any problems getting your medications? No, Patient has no issues getting medications.  Is there anything that you would like to discuss during the appointment? Yes, Patient would like to discuss stomach issues, and vertigo.   Patient is aware to have all medications and supplements near by for her phone appointment.    Follow-Up:   Pharmacist Review  Victoria Barajas, CPP notified  Victoria Barajas, [facility] Clinical Pharmacist Assistant [phone]
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
## 2020-10-21 ENCOUNTER — Other Ambulatory Visit: Payer: Self-pay

## 2020-10-21 ENCOUNTER — Ambulatory Visit: Payer: Medicare Other

## 2020-10-21 DIAGNOSIS — E782 Mixed hyperlipidemia: Secondary | ICD-10-CM

## 2020-10-21 DIAGNOSIS — I10 Essential (primary) hypertension: Secondary | ICD-10-CM

## 2020-10-21 NOTE — Patient Instructions (Addendum)
Visit Information  Thank you for your time discussing your medications. I look forward to working with you to achieve your health care goals. Below is a summary of what we talked about during our visit.   Goals Addressed            This Visit's Progress   . Pharmacy Care Plan       CARE PLAN ENTRY (see longitudinal plan of care for additional care plan information)  Current Barriers:  . Chronic Disease Management support, education, and care coordination needs related to Hypertension and Hyperlipidemia   Hypertension BP Readings from Last 3 Encounters:  09/09/20 130/80  03/11/20 108/78  02/26/20 140/86   . Pharmacist Clinical Goal(s): o Over the next 90 days, patient will work with PharmD and providers to maintain BP goal <140/90 . Current regimen:  o lisinopril 20 mg daily  . Interventions: o Discussed medication regimen and adherence.  o Reviewed diet and exercise regimen.  o Encouraged patient to keep up the good work with reduced sodium intake.  . Patient self care activities - Over the next 90 days, patient will: o Check BP as needed, document, and provide at future appointments o Ensure daily salt intake < 2300 mg/day  Hyperlipidemia Lab Results  Component Value Date/Time   LDLCALC 94 09/09/2020 09:57 AM   . Pharmacist Clinical Goal(s): o Over the next 90 days, patient will work with PharmD and providers to maintain LDL goal < 100 . Current regimen:  o atorvastatin 40 mg daily  . Interventions: o Reviewed diet and lifestyle.  o Reviewed medication adherence.  o Encouraged patient to continue with low fat diet.  . Patient self care activities - Over the next 90 days, patient will: o Continue taking mediation as prescribed.   Medication management . Pharmacist Clinical Goal(s): o Over the next 90 days, patient will work with PharmD and providers to maintain optimal medication adherence . Current pharmacy: [facility]  . Interventions o Comprehensive  medication review performed. o Continue current medication management strategy . Patient self care activities - Over the next 90 days, patient will: o Focus on medication adherence by using pill box. o Take medications as prescribed o Report any questions or concerns to PharmD and/or provider(s)  Initial goal documentation        Victoria Barajas was given information about Chronic Care Management services today including:  1. CCM service includes personalized support from designated clinical staff supervised by her physician, including individualized plan of care and coordination with other care providers 2. 24/7 contact phone numbers for assistance for urgent and routine care needs. 3. Standard insurance, coinsurance, copays and deductibles apply for chronic care management only during months in which we provide at least 20 minutes of these services. Most insurances cover these services at 100%, however patients may be responsible for any copay, coinsurance and/or deductible if applicable. This service may help you avoid the need for more expensive face-to-face services. 4. Only one practitioner may furnish and bill the service in a calendar month. 5. The patient may stop CCM services at any time (effective at the end of the month) by phone call to the office staff.  Patient agreed to services and verbal consent obtained.   The patient verbalized understanding of instructions, educational materials, and care plan provided today and agreed to receive a mailed copy of patient instructions, educational materials, and care plan.  Telephone follow up appointment with pharmacy team member scheduled for: 10/2021  Les Pou  Manson Passey, PharmD Clinical Pharmacist [facility] [phone] (office) [phone] (mobile)  Eating Plan for Osteoporosis Osteoporosis causes your bones to become weak and brittle. This puts you at greater risk for bone breaks (fractures) from small bumps or falls. Making  changes to your diet and increasing your physical activity can help strengthen your bones and improve your overall health. Calcium and vitamin D are nutrients that play an important role in bone health. Vitamin D helps your body use calcium and strengthen bones. It is important to get enough calcium and vitamin D as part of your eating plan for osteoporosis. What are tips for following this plan? Reading food labels  Try to get at least 1,000 milligrams (mg) of calcium each day.  Look for foods that have at least 50 mg of calcium per serving.  Talk with your health care provider about taking a calcium supplement if you do not get enough calcium from food.  Do not have more than 2,500 mg of calcium each day. This is the upper limit for food and nutritional supplements combined. Too much calcium may cause constipation and prevent you from absorbing other important nutrients.  Choose foods that contain vitamin D.  Take a daily vitamin supplement that contains 800-1,000 international units (IU) of vitamin D. The amount may be different depending on your age, body weight, and where you live. Talk with your dietitian or health care provider about how much vitamin D is right for you.  Avoid foods that have more than 300 mg of sodium per serving. Too much sodium can cause your body to lose calcium.  Talk with your dietitian or health care provider about how much sodium you are allowed each day. Shopping  Do not buy foods with added salt, including: ? Salted snacks. ? Rosita Fire. ? Canned soups. ? Canned meats. ? Processed meats, such as bacon or precooked or cured meat like sausages or meat loaves. ? Smoked fish. Meal planning  Eat balanced meals that contain protein foods, fruits and vegetables, and foods rich in calcium and vitamin D.  Eat at least 5 servings of fruits and vegetables each day.  Eat 5-6 oz (142-170 g) of lean meat, poultry, fish, eggs, or beans each day. Lifestyle  Do not  use any products that contain nicotine or tobacco, such as cigarettes, e-cigarettes, and chewing tobacco. If you need help quitting, ask your health care provider.  If your health care provider recommends that you lose weight: ? Work with a dietitian to develop an eating plan that will help you reach your desired weight goal. ? Exercise for at least 30 minutes a day, 5 or more days a week, or as told by your health care provider.  Work with a physical therapist to develop an exercise plan that includes flexibility, balance, and strength exercises. Do not focus only on aerobic exercise.  Do not drink alcohol if: ? Your health care provider tells you not to drink. ? You are pregnant, may be pregnant, or are planning to become pregnant.  If you drink alcohol: ? Limit how much you use to:  0-1 drink a day for women.  0-2 drinks a day for men. ? Be aware of how much alcohol is in your drink. In the [place] one drink equals one 12 oz bottle of beer (355 mL), one 5 oz glass of wine (148 mL), or one 1 oz glass of hard liquor (44 mL). What foods should I eat? Foods high in calcium  Yogurt. Yogurt  with fruit.  Milk. Evaporated skim milk. Dry milk powder.  Calcium-fortified orange juice.  Parmesan cheese. Part-skim ricotta cheese. Natural hard cheese. Cream cheese. Cottage cheese.  Canned sardines. Canned salmon.  Calcium-treated tofu. Calcium-fortified cereal bar. Calcium-fortified cereal. Calcium-fortified graham crackers.  Cooked collard greens. Turnip greens. Broccoli. Kale.  Almonds.  White beans.  Corn tortilla.   Foods high in vitamin D  Cod liver oil. Fatty fish, such as tuna, mackerel, and salmon.  Milk. Fortified soy milk. Fortified fruit juice.  Yogurt. Margarine.  Egg yolks. Foods high in protein  Beef. Lamb. Pork tenderloin.  Chicken breast.  Tuna (canned). Fish fillet.  Tofu.  Cooked soy beans. Soy patty. Beans (canned or cooked).  Cottage  cheese.  Yogurt.  Peanut butter.  Pumpkin seeds. Nuts. Sunflower seeds.  Hard cheese.  Milk or other milk products, such as soy milk. The items listed above may not be a complete list of foods and beverages you can eat. Contact a dietitian for more options. Summary  Calcium and vitamin D are nutrients that play an important role in bone health and are an important part of your eating plan for osteoporosis.  Eat balanced meals that contain protein foods, fruits and vegetables, and foods rich in calcium and vitamin D.  Avoid foods that have more than 300 mg of sodium per serving. Too much sodium can cause your body to lose calcium.  Exercise is an important part of prevention and treatment of osteoporosis. Aim for at least 30 minutes a day, 5 days a week. This information is not intended to replace advice given to you by your health care provider. Make sure you discuss any questions you have with your health care provider. Document Revised: 03/10/2020 Document Reviewed: 03/10/2020 Elsevier Patient Education  2021 [employer].

## 2020-10-28 DIAGNOSIS — S56119A Strain of flexor muscle, fascia and tendon of finger of unspecified finger at forearm level, initial encounter: Secondary | ICD-10-CM

## 2020-10-28 DIAGNOSIS — M79641 Pain in right hand: Secondary | ICD-10-CM | POA: Diagnosis not present

## 2020-10-28 DIAGNOSIS — M20011 Mallet finger of right finger(s): Secondary | ICD-10-CM | POA: Diagnosis not present

## 2020-10-28 DIAGNOSIS — M25641 Stiffness of right hand, not elsewhere classified: Secondary | ICD-10-CM | POA: Diagnosis not present

## 2020-10-28 DIAGNOSIS — S66011A Strain of long flexor muscle, fascia and tendon of right thumb at wrist and hand level, initial encounter: Secondary | ICD-10-CM | POA: Diagnosis not present

## 2020-10-28 HISTORY — DX: Strain of flexor muscle, fascia and tendon of finger of unspecified finger at forearm level, initial encounter: S56.119A

## 2020-11-18 DIAGNOSIS — M79641 Pain in right hand: Secondary | ICD-10-CM | POA: Diagnosis not present

## 2020-11-18 DIAGNOSIS — M20011 Mallet finger of right finger(s): Secondary | ICD-10-CM | POA: Diagnosis not present

## 2020-12-09 ENCOUNTER — Ambulatory Visit (INDEPENDENT_AMBULATORY_CARE_PROVIDER_SITE_OTHER): Payer: Medicare Other | Admitting: Legal Medicine

## 2020-12-09 ENCOUNTER — Other Ambulatory Visit: Payer: Self-pay

## 2020-12-09 ENCOUNTER — Encounter: Payer: Self-pay | Admitting: Legal Medicine

## 2020-12-09 VITALS — BP 132/70 | HR 75 | Temp 97.3°F | Resp 16 | Ht 66.0 in | Wt 220.6 lb

## 2020-12-09 DIAGNOSIS — K219 Gastro-esophageal reflux disease without esophagitis: Secondary | ICD-10-CM

## 2020-12-09 DIAGNOSIS — M4716 Other spondylosis with myelopathy, lumbar region: Secondary | ICD-10-CM

## 2020-12-09 DIAGNOSIS — I517 Cardiomegaly: Secondary | ICD-10-CM

## 2020-12-09 DIAGNOSIS — E782 Mixed hyperlipidemia: Secondary | ICD-10-CM | POA: Diagnosis not present

## 2020-12-09 DIAGNOSIS — I10 Essential (primary) hypertension: Secondary | ICD-10-CM | POA: Diagnosis not present

## 2020-12-09 MED ORDER — ATORVASTATIN CALCIUM 40 MG PO TABS: 40.0000 mg | ORAL_TABLET | Freq: Every day | ORAL | 2 refills | Status: DC

## 2020-12-09 MED ORDER — LISINOPRIL 20 MG PO TABS: 20.0000 mg | ORAL_TABLET | Freq: Every day | ORAL | 2 refills | Status: DC

## 2020-12-09 NOTE — Progress Notes (Signed)
Subjective:  Patient ID: Victoria Barajas, female    DOB: January 10, 1946  Age: 75 y.o. MRN: [id number]  Chief Complaint  Patient presents with  . Hypertension  . Hyperlipidemia    HPI: chronic visit  Patient presents for follow up of hypertension.  Patient tolerating lisinopril well with side effects.  Patient was diagnosed with hypertension 2010 so has been treated for hypertension for 10 years.Patient is working on maintaining diet and exercise regimen and follows up as directed. Complication include none.  Patient presents with hyperlipidemia.  Compliance with treatment has been good; patient takes medicines as directed, maintains low cholesterol diet, follows up as directed, and maintains exercise regimen.  Patient is using atorvastatin without problems.   Current Outpatient Medications on File Prior to Visit  Medication Sig Dispense Refill  . aspirin 81 MG EC tablet Take 81 mg by mouth daily. Swallow whole.    Marland Kitchen aspirin-acetaminophen-caffeine (EXCEDRIN MIGRAINE) 250-250-65 MG tablet Take 1 tablet by mouth every 6 (six) hours as needed.    . naproxen sodium (ALEVE) 220 MG tablet Take 220 mg by mouth daily as needed.     Marland Kitchen omeprazole (PRILOSEC) 40 MG capsule TAKE 1 CAPSULE BY MOUTH EVERY NIGHT AT BEDTIME 90 capsule 2  . polyethylene glycol (MIRALAX / GLYCOLAX) packet Take 17 g by mouth daily as needed.    . nitroGLYCERIN (NITROSTAT) 0.4 MG SL tablet Place 1 tablet (0.4 mg total) under the tongue every 5 (five) minutes as needed. 25 tablet 11   No current facility-administered medications on file prior to visit.   Past Medical History:  Diagnosis Date  . Acid reflux   . High cholesterol   . Migraine    Past Surgical History:  Procedure Laterality Date  . ABDOMINAL HYSTERECTOMY    . TUBAL LIGATION    . WRIST SURGERY      Family History  Problem Relation Age of Onset  . Heart Problems Mother   . Diabetes Father   . Diabetes Sister   . Kidney disease Sister   . Diabetes Brother    . Alzheimer's disease Brother   . Heart attack Brother    Social History   Socioeconomic History  . Marital status: Married    Spouse name: Not on file  . Number of children: Not on file  . Years of education: Not on file  . Highest education level: Not on file  Occupational History  . Not on file  Tobacco Use  . Smoking status: Former Smoker    Types: Cigarettes    Quit date: 1993    Years since quitting: 29.1  . Smokeless tobacco: Never Used  Substance and Sexual Activity  . Alcohol use: No  . Drug use: No  . Sexual activity: Not Currently  Other Topics Concern  . Not on file  Social History Narrative  . Not on file   Social Determinants of Health   Financial Resource Strain: Not on file  Food Insecurity: No Food Insecurity  . Worried About Programme researcher, broadcasting/film/video in the Last Year: Never true  . Ran Out of Food in the Last Year: Never true  Transportation Needs: No Transportation Needs  . Lack of Transportation (Medical): No  . Lack of Transportation (Non-Medical): No  Physical Activity: Not on file  Stress: Not on file  Social Connections: Not on file    Review of Systems  Constitutional: Negative for activity change, appetite change and unexpected weight change.  HENT: Negative for congestion and  sinus pain.   Eyes: Negative for visual disturbance.  Respiratory: Negative for chest tightness and shortness of breath.   Cardiovascular: Negative for chest pain, palpitations and leg swelling.  Gastrointestinal: Negative.  Negative for abdominal distention and abdominal pain.  Endocrine: Negative for polyuria.  Genitourinary: Negative for difficulty urinating, dyspareunia and urgency.  Musculoskeletal: Negative for arthralgias and back pain.  Skin: Negative.   Neurological: Negative.   Psychiatric/Behavioral: Negative.      Objective:  BP 132/70 (BP Location: Right Arm, Patient Position: Sitting, Cuff Size: Normal)   Pulse 75   Temp (!) 97.3 F (36.3 C)  (Temporal)   Resp 16   Ht 5\' 6"  (1.676 m)   Wt 220 lb 9.6 oz (100.1 kg)   SpO2 97%   BMI 35.61 kg/m   BP/Weight 12/09/2020 09/09/2020 03/11/2020  Systolic BP 132 130 108  Diastolic BP 70 80 78  Wt. (Lbs) 220.6 218 220.6  BMI 35.61 35.19 36.71    Physical Exam Vitals reviewed.  Constitutional:      Appearance: Normal appearance.  HENT:     Head: Normocephalic.     Right Ear: Tympanic membrane normal.     Left Ear: Tympanic membrane normal.     Nose: Nose normal.     Mouth/Throat:     Mouth: Mucous membranes are moist.  Eyes:     Extraocular Movements: Extraocular movements intact.     Conjunctiva/sclera: Conjunctivae normal.     Pupils: Pupils are equal, round, and reactive to light.  Cardiovascular:     Rate and Rhythm: Normal rate and regular rhythm.     Pulses: Normal pulses.     Heart sounds: Normal heart sounds. No murmur heard. No gallop.   Pulmonary:     Effort: Pulmonary effort is normal. No respiratory distress.     Breath sounds: Normal breath sounds. No rales.  Abdominal:     General: Abdomen is flat. Bowel sounds are normal. There is no distension.     Palpations: Abdomen is soft.     Tenderness: There is no abdominal tenderness.  Musculoskeletal:        General: No tenderness. Normal range of motion.     Cervical back: Normal range of motion and neck supple.  Skin:    General: Skin is warm and dry.     Capillary Refill: Capillary refill takes less than 2 seconds.  Neurological:     General: No focal deficit present.     Mental Status: She is alert and oriented to person, place, and time.  Psychiatric:        Mood and Affect: Mood normal.        Thought Content: Thought content normal.        Judgment: Judgment normal.       Lab Results  Component Value Date   WBC 5.2 12/09/2020   HGB 14.2 12/09/2020   HCT 42.0 12/09/2020   PLT 250 12/09/2020   GLUCOSE 104 (H) 12/09/2020   CHOL 120 12/09/2020   TRIG 91 12/09/2020   HDL 42 12/09/2020    LDLCALC 60 12/09/2020   ALT 24 12/09/2020   AST 22 12/09/2020   NA 143 12/09/2020   K 4.5 12/09/2020   CL 106 12/09/2020   CREATININE 1.01 (H) 12/09/2020   BUN 13 12/09/2020   CO2 20 12/09/2020   TSH 2.090 09/09/2020   INR 0.95 11/08/2017      Assessment & Plan:  Diagnoses and all orders for this visit: Mixed  hyperlipidemia -     Lipid panel -     atorvastatin (LIPITOR) 40 MG tablet; Take 1 tablet (40 mg total) by mouth daily. AN INDIVIDUAL CARE PLAN for hyperlipidemia/ cholesterol was established and reinforced today.  The patient's status was assessed using clinical findings on exam, lab and other diagnostic tests. The patient's disease status was assessed based on evidence-based guidelines and found to be well controlled. MEDICATIONS were reviewed. SELF MANAGEMENT GOALS have been discussed and patient's success at attaining the goal of low cholesterol was assessed. RECOMMENDATION given include regular exercise 3 days a week and low cholesterol/low fat diet. CLINICAL SUMMARY including written plan to identify barriers unique to the patient due to social or economic  reasons was discussed.  Essential hypertension -     CBC with Differential/Platelet -     Comprehensive metabolic panel -     lisinopril (ZESTRIL) 20 MG tablet; Take 1 tablet (20 mg total) by mouth daily. An individual hypertension care plan was established and reinforced today.  The patient's status was assessed using clinical findings on exam and labs or diagnostic tests. The patient's success at meeting treatment goals on disease specific evidence-based guidelines and found to be well controlled. SELF MANAGEMENT: The patient and I together assessed ways to personally work towards obtaining the recommended goals. RECOMMENDATIONS: avoid decongestants found in common cold remedies, decrease consumption of alcohol, perform routine monitoring of BP with home BP cuff, exercise, reduction of dietary salt, take medicines as  prescribed, try not to miss doses and quit smoking.  Regular exercise and maintaining a healthy weight is needed.  Stress reduction may help. A CLINICAL SUMMARY including written plan identify barriers to care unique to individual due to social or financial issues.  We attempt to mutually creat solutions for individual and family understanding.  Gastroesophageal reflux disease without esophagitis Plan of care was formulated today.  he is doing well.  A plan of care was formulated using patient exam, tests and other sources to optimize care using evidence based information.  Recommend no smoking, no eating after supper, avoid fatty foods, elevate Head of bed, avoid tight fitting clothing.  Continue on OTC . LVH (left ventricular hypertrophy) Patient has enlarged heart and seen by cardiology  Morbid obesity [facility]) An individualize plan was formulated for obesity using patient history and physical exam to encourage weight loss.  An evidence based program was formulated.  Patient is to cut portion size with meals and to plan physical exercise 3 days a week at least 20 minutes.  Weight watchers and other programs are helpful.  Planned amount of weight loss 10 lbs.  Spondylosis with myelopathy, lumbar region Patient has chronic lumbar back pain and is being treated Other orders -     Cardiovascular Risk Assessment     I spent 25 minutes dedicated to the care of this patient on the date of this encounter to include face-to-face time with the patient, as well as:   Follow-up: Return in about 6 months (around 06/11/2021) for fasting.  An After Visit Summary was printed and given to the patient.  Brent Bulla, MD [facility] [phone]

## 2020-12-10 LAB — CBC WITH DIFFERENTIAL/PLATELET
Basophils Absolute: 0 10*3/uL (ref 0.0–0.2)
Basos: 1 %
EOS (ABSOLUTE): 0.1 10*3/uL (ref 0.0–0.4)
Eos: 2 %
Hematocrit: 42 % (ref 34.0–46.6)
Hemoglobin: 14.2 g/dL (ref 11.1–15.9)
Immature Grans (Abs): 0 10*3/uL (ref 0.0–0.1)
Immature Granulocytes: 0 %
Lymphocytes Absolute: 1.2 10*3/uL (ref 0.7–3.1)
Lymphs: 23 %
MCH: 29.8 pg (ref 26.6–33.0)
MCHC: 33.8 g/dL (ref 31.5–35.7)
MCV: 88 fL (ref 79–97)
Monocytes Absolute: 0.4 10*3/uL (ref 0.1–0.9)
Monocytes: 8 %
Neutrophils Absolute: 3.5 10*3/uL (ref 1.4–7.0)
Neutrophils: 66 %
Platelets: 250 10*3/uL (ref 150–450)
RBC: 4.76 x10E6/uL (ref 3.77–5.28)
RDW: 12.5 % (ref 11.7–15.4)
WBC: 5.2 10*3/uL (ref 3.4–10.8)

## 2020-12-10 LAB — COMPREHENSIVE METABOLIC PANEL
ALT: 24 IU/L (ref 0–32)
AST: 22 IU/L (ref 0–40)
Albumin/Globulin Ratio: 2.4 — ABNORMAL HIGH (ref 1.2–2.2)
Albumin: 4.8 g/dL — ABNORMAL HIGH (ref 3.7–4.7)
Alkaline Phosphatase: 94 IU/L (ref 44–121)
BUN/Creatinine Ratio: 13 (ref 12–28)
BUN: 13 mg/dL (ref 8–27)
Bilirubin Total: 1.6 mg/dL — ABNORMAL HIGH (ref 0.0–1.2)
CO2: 20 mmol/L (ref 20–29)
Calcium: 10.3 mg/dL (ref 8.7–10.3)
Chloride: 106 mmol/L (ref 96–106)
Creatinine, Ser: 1.01 mg/dL — ABNORMAL HIGH (ref 0.57–1.00)
Globulin, Total: 2 g/dL (ref 1.5–4.5)
Glucose: 104 mg/dL — ABNORMAL HIGH (ref 65–99)
Potassium: 4.5 mmol/L (ref 3.5–5.2)
Sodium: 143 mmol/L (ref 134–144)
Total Protein: 6.8 g/dL (ref 6.0–8.5)
eGFR: 59 mL/min/{1.73_m2} — ABNORMAL LOW (ref >59)

## 2020-12-10 LAB — LIPID PANEL
Chol/HDL Ratio: 2.9 ratio (ref 0.0–4.4)
Cholesterol, Total: 120 mg/dL (ref 100–199)
HDL: 42 mg/dL (ref >39)
LDL Chol Calc (NIH): 60 mg/dL (ref 0–99)
Triglycerides: 91 mg/dL (ref 0–149)
VLDL Cholesterol Cal: 18 mg/dL (ref 5–40)

## 2020-12-10 LAB — CARDIOVASCULAR RISK ASSESSMENT

## 2020-12-11 NOTE — Progress Notes (Signed)
Cbc normal, glucose 104, kidney tests stage 3a, bilirubin high, suggest ultrasound abdomen, cholesterol normal,  lp

## 2020-12-21 ENCOUNTER — Other Ambulatory Visit: Payer: Self-pay

## 2020-12-21 ENCOUNTER — Other Ambulatory Visit: Payer: Self-pay | Admitting: Legal Medicine

## 2020-12-21 DIAGNOSIS — R17 Unspecified jaundice: Secondary | ICD-10-CM

## 2020-12-22 ENCOUNTER — Telehealth: Payer: Self-pay

## 2020-12-22 NOTE — Telephone Encounter (Signed)
   Victoria Barajas has been scheduled for the following appointment:  WHAT: GB/RUQ ultrasound WHERE: [facility] outpatient center DATE: 12/26/2020 TIME: 9:30AM  Pt was notified and aware of upcoming appointment an the number to call if she has to reschedule or have any questions about it.

## 2020-12-26 DIAGNOSIS — R17 Unspecified jaundice: Secondary | ICD-10-CM | POA: Diagnosis not present

## 2020-12-26 DIAGNOSIS — K760 Fatty (change of) liver, not elsewhere classified: Secondary | ICD-10-CM | POA: Diagnosis not present

## 2020-12-28 ENCOUNTER — Telehealth: Payer: Self-pay

## 2020-12-28 NOTE — Telephone Encounter (Signed)
Patient notified of US results

## 2021-02-14 ENCOUNTER — Telehealth: Payer: Self-pay

## 2021-02-14 NOTE — Progress Notes (Signed)
Chronic Care Management Pharmacy Assistant   Name: Victoria Barajas  MRN: [id number] DOB: 23-May-1946   Reason for Encounter: Disease State fr hypertension  Recent office visits:  12/22/20- patient has GB/RUQ ultrasound on 12/26/20  12/09/20-Dr. Marina Goodell PCP, labs, follow up 61mos, no med changes, Cbc normal, glucose 104, kidney tests stage 3a, bilirubin high, suggest ultrasound abdomen, cholesterol normal,   Recent consult visits:  nnone  Hospital visits:  None in previous 6 months  Medications: Outpatient Encounter Medications as of 02/14/2021  Medication Sig  . aspirin 81 MG EC tablet Take 81 mg by mouth daily. Swallow whole.  Marland Kitchen aspirin-acetaminophen-caffeine (EXCEDRIN MIGRAINE) 250-250-65 MG tablet Take 1 tablet by mouth every 6 (six) hours as needed.  Marland Kitchen atorvastatin (LIPITOR) 40 MG tablet Take 1 tablet (40 mg total) by mouth daily.  Marland Kitchen lisinopril (ZESTRIL) 20 MG tablet Take 1 tablet (20 mg total) by mouth daily.  . naproxen sodium (ALEVE) 220 MG tablet Take 220 mg by mouth daily as needed.   . nitroGLYCERIN (NITROSTAT) 0.4 MG SL tablet Place 1 tablet (0.4 mg total) under the tongue every 5 (five) minutes as needed.  Marland Kitchen omeprazole (PRILOSEC) 40 MG capsule TAKE 1 CAPSULE BY MOUTH EVERY NIGHT AT BEDTIME  . polyethylene glycol (MIRALAX / GLYCOLAX) packet Take 17 g by mouth daily as needed.   No facility-administered encounter medications on file as of 02/14/2021.     Recent Office Vitals: BP Readings from Last 3 Encounters:  12/09/20 132/70  09/09/20 130/80  03/11/20 108/78   Pulse Readings from Last 3 Encounters:  12/09/20 75  09/09/20 76  03/11/20 66    Wt Readings from Last 3 Encounters:  12/09/20 220 lb 9.6 oz (100.1 kg)  09/09/20 218 lb (98.9 kg)  03/11/20 220 lb 9.6 oz (100.1 kg)     Kidney Function Lab Results  Component Value Date/Time   CREATININE 1.01 (H) 12/09/2020 10:28 AM   CREATININE 0.94 09/09/2020 09:57 AM   GFRNONAA 60 09/09/2020 09:57 AM   GFRAA 70  09/09/2020 09:57 AM    BMP Latest Ref Rng & Units 12/09/2020 09/09/2020 03/11/2020  Glucose 65 - 99 mg/dL 277(A) 128(N) 867(E)  BUN 8 - 27 mg/dL 13 21 16   Creatinine 0.57 - 1.00 mg/dL ) 7.20(N 4.70  BUN/Creat Ratio 12 - 28 13 22 17   Sodium 134 - 144 mmol/L 143 141 143  Potassium 3.5 - 5.2 mmol/L 4.5 4.7 4.7  Chloride 96 - 106 mmol/L 106 105 106  CO2 20 - 29 mmol/L 20 22 24   Calcium 8.7 - 10.3 mg/dL 9.62 9.9     . Current antihypertensive regimen:  Lisinopril  Patient verbally confirms she is taking the above medications as directed. Yes, she denies any issues with her medication.   How often are you checking your Blood Pressure?  Patient stated she has not been checking her blood pressures.  . Wrist or arm cuff: Patient stated she uses arm cuff  . Caffeine intake: Patient stated she has 1-2 cups of coffee in the mornings  . Salt intake: Patient watches her salt intake, on occasion she does have lightly salted chips.  . OTC medications including pseudoephedrine or NSAIDs? Patient takes 81mg  aspirin  . What recent interventions/DTPs have been made by any provider to improve Blood Pressure control since last CPP Visit: Patient has not been checking her blood pressures, she takes her medication as directed. Patient stated she is doing well, she denies any issues at this time.    Any recent hospitalizations or ED visits since last visit with CPP? No  . What diet changes have been made to improve Blood Pressure Control?  Patient watches her salt intake, sometimes she will have some lightly salted chips.   . What exercise is being done to improve your Blood Pressure Control?  Patient stated she has been doing lots of yard work lately.   Adherence Review: Is the patient currently on ACE/ARB medication? Yes Does the patient have >5 day gap between last estimated fill dates? CPP to review   Star Rating Drugs:  Medication:  Last Fill: Day  Supply Lisinopril  01/31/21 90 Atorvastatin  12/05/20 90     Leilani Able, [place] Clinical Pharmacist Assistant [phone]

## 2021-05-22 ENCOUNTER — Telehealth: Payer: Self-pay

## 2021-05-22 NOTE — Chronic Care Management (AMB) (Signed)
Chronic Care Management Pharmacy Assistant   Name: Victoria Barajas  MRN: [id number] DOB: 21-Jan-1946  Reason for Encounter: Disease State/ Hypertension, Cholesterol   Recent office visits:  None  Recent consult visits:  None  Hospital visits:  None in previous 6 months  Medications: Outpatient Encounter Medications as of 05/22/2021  Medication Sig   aspirin 81 MG EC tablet Take 81 mg by mouth daily. Swallow whole.   aspirin-acetaminophen-caffeine (EXCEDRIN MIGRAINE) 250-250-65 MG tablet Take 1 tablet by mouth every 6 (six) hours as needed.   atorvastatin (LIPITOR) 40 MG tablet Take 1 tablet (40 mg total) by mouth daily.   lisinopril (ZESTRIL) 20 MG tablet Take 1 tablet (20 mg total) by mouth daily.   naproxen sodium (ALEVE) 220 MG tablet Take 220 mg by mouth daily as needed.    nitroGLYCERIN (NITROSTAT) 0.4 MG SL tablet Place 1 tablet (0.4 mg total) under the tongue every 5 (five) minutes as needed.   omeprazole (PRILOSEC) 40 MG capsule TAKE 1 CAPSULE BY MOUTH EVERY NIGHT AT BEDTIME   polyethylene glycol (MIRALAX / GLYCOLAX) packet Take 17 g by mouth daily as needed.   No facility-administered encounter medications on file as of 05/22/2021.    Recent Office Vitals: BP Readings from Last 3 Encounters:  12/09/20 132/70  09/09/20 130/80  03/11/20 108/78   Pulse Readings from Last 3 Encounters:  12/09/20 75  09/09/20 76  03/11/20 66    Wt Readings from Last 3 Encounters:  12/09/20 220 lb 9.6 oz (100.1 kg)  09/09/20 218 lb (98.9 kg)  03/11/20 220 lb 9.6 oz (100.1 kg)     Kidney Function Lab Results  Component Value Date/Time   CREATININE 1.01 (H) 12/09/2020 10:28 AM   CREATININE 0.94 09/09/2020 09:57 AM   GFRNONAA 60 09/09/2020 09:57 AM   GFRAA 70 09/09/2020 09:57 AM    BMP Latest Ref Rng & Units 12/09/2020 09/09/2020 03/11/2020  Glucose 65 - 99 mg/dL 825(K) 539(J) 673(A)  BUN 8 - 27 mg/dL 13 21 16   Creatinine 0.57 - 1.00 mg/dL ) 1.93(X 9.02  BUN/Creat Ratio 12  - 28 13 22 17   Sodium 134 - 144 mmol/L 143 141 143  Potassium 3.5 - 5.2 mmol/L 4.5 4.7 4.7  Chloride 96 - 106 mmol/L 106 105 106  CO2 20 - 29 mmol/L 20 22 24   Calcium 8.7 - 10.3 mg/dL 4.09 9.9     Current antihypertensive regimen:  Lisinopril 20 mg daily  Patient verbally confirms she is taking the above medications as directed. Yes  How often are you checking your Blood Pressure? infrequently  she checks her blood pressure sometimes at [employer] after taking medications  Current home BP readings: None patient states she doesn't check blood pressure at home but when she goes grocery shopping at [employer] she checks it. Patient stated she couldn't remember last reading but it was normal.  Wrist or arm cuff: Patient states cuff. Patient stated the cuff she has a large cuff but it fits small. Patient will try and get another cuff soon.  Caffeine intake: Patient states she drinks 2 cups of coffee daily Salt intake: Patient states she watches her salt intake OTC medications including pseudoephedrine or NSAIDs? Patient states AZO for recurrent UTIs.  Any readings above 180/120? No   What recent interventions/DTPs have been made by any provider to improve Blood Pressure control since last CPP Visit:  Ensure daily salt intake < 2300 mg/day Check BP as needed, document, and provide at future  appointments  Any recent hospitalizations or ED visits since last visit with CPP? No  What diet changes have been made to improve Blood Pressure Control?  Patient states she drinks 3 bottles of water daily and eats fruits/vegetables. Patient states she is working on eating healthier.   What exercise is being done to improve your Blood Pressure Control?  Patient states she doesn't exercise much but does clean up around her home and yard frequently.  Adherence Review: Is the patient currently on ACE/ARB medication? No Does the patient have >5 day gap between last estimated fill dates? CPP to  review   Lipid Panel    Component Value Date/Time   CHOL 120 12/09/2020 1028   TRIG 91 12/09/2020 1028   HDL 42 12/09/2020 1028   LDLCALC 60 12/09/2020 1028    10-year ASCVD risk score: The ASCVD Risk score Denman George DC Jr., et al., 2013) failed to calculate for the following reasons:   The valid total cholesterol range is 130 to 320 mg/dL  Current antihyperlipidemic regimen:  Atorvastatin 40 mg daily  Previous antihyperlipidemic medications tried: None  ASCVD risk enhancing conditions: age >60 and HTN  What recent interventions/DTPs have been made by any provider to improve Cholesterol control since last CPP Visit:  Encouraged patient to continue with low fat diet.   Any recent hospitalizations or ED visits since last visit with CPP? No  What diet changes have been made to improve Cholesterol?  Patient states she doesn't eat fried meat much but does eat fried vegetables at times.  What exercise is being done to improve Cholesterol?  Patient states she doesn't exercise much but does clean up around her home and yard frequently.  Adherence Review: Does the patient have >5 day gap between last estimated fill dates? No  Star Rating Drugs Medication Name Last Fill Days supply Atorvastatin 40 mg 03-03-2021 90DS  Lisinopril 20 mg 05-05-2021 90DS   Care Gaps: Last annual wellness visit? Needs scheduling 08-31-2020 was canceled Dexa scan overdue Colonoscopy overdue Tdap overdue Shingrix overdue RAF= 0.325%   Huey Romans [facility] Clinical Pharmacist Assistant [phone]

## 2021-06-11 ENCOUNTER — Telehealth: Payer: Self-pay

## 2021-06-11 NOTE — Telephone Encounter (Signed)
Patient needs to contact the office to schedule initial Medicare Annual Wellness Visit.  Patient decline telephone visit.   [facility] [address] [place] [address] Dept: [phone] Dept Fax: [phone] Loc: [phone] Loc Fax: [phone]   Valarie Cones

## 2021-06-13 ENCOUNTER — Ambulatory Visit (INDEPENDENT_AMBULATORY_CARE_PROVIDER_SITE_OTHER): Payer: Medicare Other | Admitting: Legal Medicine

## 2021-06-13 ENCOUNTER — Encounter: Payer: Self-pay | Admitting: Legal Medicine

## 2021-06-13 ENCOUNTER — Other Ambulatory Visit: Payer: Self-pay

## 2021-06-13 VITALS — BP 146/82 | HR 69 | Temp 97.7°F | Resp 16 | Ht 66.0 in | Wt 219.0 lb

## 2021-06-13 DIAGNOSIS — K219 Gastro-esophageal reflux disease without esophagitis: Secondary | ICD-10-CM

## 2021-06-13 DIAGNOSIS — E782 Mixed hyperlipidemia: Secondary | ICD-10-CM

## 2021-06-13 DIAGNOSIS — N3941 Urge incontinence: Secondary | ICD-10-CM | POA: Insufficient documentation

## 2021-06-13 DIAGNOSIS — I10 Essential (primary) hypertension: Secondary | ICD-10-CM | POA: Diagnosis not present

## 2021-06-13 DIAGNOSIS — I517 Cardiomegaly: Secondary | ICD-10-CM | POA: Diagnosis not present

## 2021-06-13 HISTORY — DX: Urge incontinence: N39.41

## 2021-06-13 NOTE — Progress Notes (Signed)
Acute Office Visit  Subjective:    Patient ID: Victoria Barajas, female    DOB: July 10, 1946, 75 y.o.   MRN: [id number]  Chief Complaint  Patient presents with   Hypertension   Hyperlipidemia   Gastroesophageal Reflux    HPI Patient is in today for chronic visit  Cardiomegaly with LBBB  Patient presents for follow up of hypertension.  Patient tolerating lisinpril well with side effects.  Patient was diagnosed with hypertension 2010 so has been treated for hypertension for 10 years.Patient is working on maintaining diet and exercise regimen and follows up as directed. Complication include none.   Patient presents with hyperlipidemia.  Compliance with treatment has been good; patient takes medicines as directed, maintains low cholesterol diet, follows up as directed, and maintains exercise regimen.  Patient is using atorvastatin without problems.   Patient has gastroesophageal reflux symptoms with esophagitis and LTRD.  The symptoms are moderate intensity.  Length of symptoms 10 years.  Medicines include prilosec.  Complications include none.   Past Medical History:  Diagnosis Date   Acid reflux    High cholesterol    Migraine     Past Surgical History:  Procedure Laterality Date   ABDOMINAL HYSTERECTOMY     TUBAL LIGATION     WRIST SURGERY      Family History  Problem Relation Age of Onset   Heart Problems Mother    Diabetes Father    Diabetes Sister    Kidney disease Sister    Diabetes Brother    Alzheimer's disease Brother    Heart attack Brother     Social History   Socioeconomic History   Marital status: Married    Spouse name: Not on file   Number of children: Not on file   Years of education: Not on file   Highest education level: Not on file  Occupational History   Not on file  Tobacco Use   Smoking status: Former    Types: Cigarettes    Quit date: 1993    Years since quitting: 29.6   Smokeless tobacco: Never  Substance and Sexual Activity    Alcohol use: No   Drug use: No   Sexual activity: Not Currently  Other Topics Concern   Not on file  Social History Narrative   Not on file   Social Determinants of Health   Financial Resource Strain: Not on file  Food Insecurity: No Food Insecurity   Worried About Running Out of Food in the Last Year: Never true   [place] in the Last Year: Never true  Transportation Needs: No Transportation Needs   Lack of Transportation (Medical): No   Lack of Transportation (Non-Medical): No  Physical Activity: Not on file  Stress: Not on file  Social Connections: Not on file  Intimate Partner Violence: Not on file    Outpatient Medications Prior to Visit  Medication Sig Dispense Refill   aspirin 81 MG EC tablet Take 81 mg by mouth daily. Swallow whole.     aspirin-acetaminophen-caffeine (EXCEDRIN MIGRAINE) 250-250-65 MG tablet Take 1 tablet by mouth every 6 (six) hours as needed.     atorvastatin (LIPITOR) 40 MG tablet Take 1 tablet (40 mg total) by mouth daily. 90 tablet 2   lisinopril (ZESTRIL) 20 MG tablet Take 1 tablet (20 mg total) by mouth daily. 90 tablet 2   naproxen sodium (ALEVE) 220 MG tablet Take 220 mg by mouth daily as needed.      omeprazole (PRILOSEC)  40 MG capsule TAKE 1 CAPSULE BY MOUTH EVERY NIGHT AT BEDTIME 90 capsule 2   polyethylene glycol (MIRALAX / GLYCOLAX) packet Take 17 g by mouth daily as needed.     nitroGLYCERIN (NITROSTAT) 0.4 MG SL tablet Place 1 tablet (0.4 mg total) under the tongue every 5 (five) minutes as needed. 25 tablet 11   No facility-administered medications prior to visit.    Allergies  Allergen Reactions   Prolia [Denosumab] Other (See Comments)    Severe myalgia   Alendronate Other (See Comments)    Severe fatigue and aching   Tape Rash    SOME ADHESIVES LEAVE RASH, RED AREA    Review of Systems  Constitutional:  Negative for activity change and appetite change.  HENT:  Negative for congestion.   Eyes:  Negative for visual  disturbance.  Respiratory:  Negative for chest tightness and shortness of breath.   Cardiovascular:  Negative for chest pain, palpitations and leg swelling.  Gastrointestinal:  Negative for abdominal distention and abdominal pain.  Genitourinary:  Positive for urgency. Negative for difficulty urinating and dysuria.  Musculoskeletal:  Negative for arthralgias and back pain.  Neurological: Negative.       Objective:    Physical Exam Vitals reviewed.  Constitutional:      General: She is not in acute distress.    Appearance: Normal appearance.  HENT:     Head: Normocephalic.     Right Ear: Tympanic membrane, ear canal and external ear normal.     Left Ear: Tympanic membrane, ear canal and external ear normal.     Nose: Nose normal. No congestion.     Mouth/Throat:     Mouth: Mucous membranes are moist.     Pharynx: Oropharynx is clear. No oropharyngeal exudate.  Eyes:     Extraocular Movements: Extraocular movements intact.     Conjunctiva/sclera: Conjunctivae normal.     Pupils: Pupils are equal, round, and reactive to light.  Cardiovascular:     Rate and Rhythm: Normal rate and regular rhythm.     Pulses: Normal pulses.     Heart sounds: Normal heart sounds. No murmur heard.   No gallop.  Pulmonary:     Effort: Pulmonary effort is normal. No respiratory distress.     Breath sounds: Normal breath sounds. No wheezing.  Abdominal:     General: Abdomen is flat. Bowel sounds are normal. There is no distension.     Palpations: Abdomen is soft.     Tenderness: There is no abdominal tenderness.  Musculoskeletal:        General: Normal range of motion.     Cervical back: Normal range of motion.     Right lower leg: No edema.     Left lower leg: No edema.  Skin:    General: Skin is warm and dry.     Capillary Refill: Capillary refill takes less than 2 seconds.  Neurological:     General: No focal deficit present.     Mental Status: She is alert and oriented to person, place,  and time. Mental status is at baseline.    BP (!) 146/82   Pulse 69   Temp 97.7 F (36.5 C)   Resp 16   Ht _0  (1.676 m)   Wt 219 lb (99.3 kg)   SpO2 97%   BMI 35.35 kg/m  Wt Readings from Last 3 Encounters:  06/13/21 219 lb (99.3 kg)  12/09/20 220 lb 9.6 oz (100.1 kg)  09/09/20 218  lb (98.9 kg)    Health Maintenance Due  Topic Date Due   Hepatitis C Screening  Never done   Zoster Vaccines- Shingrix (1 of 2) Never done   COVID-19 Vaccine (3 - Moderna risk series) 02/05/2020   TETANUS/TDAP  08/22/2020   INFLUENZA VACCINE  05/08/2021    There are no preventive care reminders to display for this patient.   Lab Results  Component Value Date   TSH 2.090 09/09/2020   Lab Results  Component Value Date   WBC 5.2 12/09/2020   HGB 14.2 12/09/2020   HCT 42.0 12/09/2020   MCV 88 12/09/2020   PLT 250 12/09/2020   Lab Results  Component Value Date   NA 143 12/09/2020   K 4.5 12/09/2020   CO2 20 12/09/2020   GLUCOSE 104 (H) 12/09/2020   BUN 13 12/09/2020   CREATININE 1.01 (H) 12/09/2020   BILITOT 1.6 (H) 12/09/2020   ALKPHOS 94 12/09/2020   AST 22 12/09/2020   ALT 24 12/09/2020   PROT 6.8 12/09/2020   ALBUMIN 4.8 (H) 12/09/2020   CALCIUM 10.3 12/09/2020   ANIONGAP 14 11/08/2017   EGFR 59 (L) 12/09/2020   Lab Results  Component Value Date   CHOL 120 12/09/2020   Lab Results  Component Value Date   HDL 42 12/09/2020   Lab Results  Component Value Date   LDLCALC 60 12/09/2020   Lab Results  Component Value Date   TRIG 91 12/09/2020   Lab Results  Component Value Date   CHOLHDL 2.9 12/09/2020   No results found for: HGBA1C     Assessment & Plan:  1. Essential hypertension - Comprehensive metabolic panel - CBC with Differential/Platelet An individual hypertension care plan was established and reinforced today.  The patient's status was assessed using clinical findings on exam and labs or diagnostic tests. The patient's success at meeting treatment  goals on disease specific evidence-based guidelines and found to be well controlled. SELF MANAGEMENT: The patient and I together assessed ways to personally work towards obtaining the recommended goals. RECOMMENDATIONS: avoid decongestants found in common cold remedies, decrease consumption of alcohol, perform routine monitoring of BP with home BP cuff, exercise, reduction of dietary salt, take medicines as prescribed, try not to miss doses and quit smoking.  Regular exercise and maintaining a healthy weight is needed.  Stress reduction may help. A CLINICAL SUMMARY including written plan identify barriers to care unique to individual due to social or financial issues.  We attempt to mutually creat solutions for individual and family understanding.   2. Mixed hyperlipidemia - Lipid panel AN INDIVIDUAL CARE PLAN for hyperlipidemia/ cholesterol was established and reinforced today.  The patient's status was assessed using clinical findings on exam, lab and other diagnostic tests. The patient's disease status was assessed based on evidence-based guidelines and found to be fair controlled. MEDICATIONS were reviewed. SELF MANAGEMENT GOALS have been discussed and patient's success at attaining the goal of low cholesterol was assessed. RECOMMENDATION given include regular exercise 3 days a week and low cholesterol/low fat diet. CLINICAL SUMMARY including written plan to identify barriers unique to the patient due to social or economic  reasons was discussed.   3. Gastroesophageal reflux disease without esophagitis Plan of care was formulated today.  She is doing well.  A plan of care was formulated using patient exam, tests and other sources to optimize care using evidence based information.  Recommend no smoking, no eating after supper, avoid fatty foods, elevate Head of bed,  avoid tight fitting clothing.  Continue on omeprazole.   4. LVH (left ventricular hypertrophy) Patient has LVH and LBBB  5.  Urgency incontinence  Patient has urgency incontinence but does not want medicines     Orders Placed This Encounter  Procedures   Comprehensive metabolic panel   Lipid panel   CBC with Differential/Platelet      I spent 25 minutes dedicated to the care of this patient on the date of this encounter to include face-to-face time with the patient, as well as:   Follow-up: Return in about 6 months (around 12/11/2021) for fasting.  An After Visit Summary was printed and given to the patient.  Reinaldo Meeker, MD [facility] [phone]

## 2021-06-14 LAB — COMPREHENSIVE METABOLIC PANEL
ALT: 19 IU/L (ref 0–32)
AST: 21 IU/L (ref 0–40)
Albumin/Globulin Ratio: 2.3 — ABNORMAL HIGH (ref 1.2–2.2)
Albumin: 4.4 g/dL (ref 3.7–4.7)
Alkaline Phosphatase: 81 IU/L (ref 44–121)
BUN/Creatinine Ratio: 14 (ref 12–28)
BUN: 14 mg/dL (ref 8–27)
Bilirubin Total: 1 mg/dL (ref 0.0–1.2)
CO2: 24 mmol/L (ref 20–29)
Calcium: 10 mg/dL (ref 8.7–10.3)
Chloride: 106 mmol/L (ref 96–106)
Creatinine, Ser: 0.98 mg/dL (ref 0.57–1.00)
Globulin, Total: 1.9 g/dL (ref 1.5–4.5)
Glucose: 102 mg/dL — ABNORMAL HIGH (ref 65–99)
Potassium: 4.4 mmol/L (ref 3.5–5.2)
Sodium: 144 mmol/L (ref 134–144)
Total Protein: 6.3 g/dL (ref 6.0–8.5)
eGFR: 61 mL/min/{1.73_m2} (ref >59)

## 2021-06-14 LAB — CBC WITH DIFFERENTIAL/PLATELET
Basophils Absolute: 0.1 10*3/uL (ref 0.0–0.2)
Basos: 1 %
EOS (ABSOLUTE): 0.2 10*3/uL (ref 0.0–0.4)
Eos: 3 %
Hematocrit: 39.6 % (ref 34.0–46.6)
Hemoglobin: 13.2 g/dL (ref 11.1–15.9)
Immature Grans (Abs): 0 10*3/uL (ref 0.0–0.1)
Immature Granulocytes: 0 %
Lymphocytes Absolute: 1.1 10*3/uL (ref 0.7–3.1)
Lymphs: 22 %
MCH: 29.8 pg (ref 26.6–33.0)
MCHC: 33.3 g/dL (ref 31.5–35.7)
MCV: 89 fL (ref 79–97)
Monocytes Absolute: 0.4 10*3/uL (ref 0.1–0.9)
Monocytes: 9 %
Neutrophils Absolute: 3.2 10*3/uL (ref 1.4–7.0)
Neutrophils: 65 %
Platelets: 225 10*3/uL (ref 150–450)
RBC: 4.43 x10E6/uL (ref 3.77–5.28)
RDW: 12.8 % (ref 11.7–15.4)
WBC: 4.9 10*3/uL (ref 3.4–10.8)

## 2021-06-14 LAB — LIPID PANEL
Chol/HDL Ratio: 3 ratio (ref 0.0–4.4)
Cholesterol, Total: 149 mg/dL (ref 100–199)
HDL: 50 mg/dL (ref >39)
LDL Chol Calc (NIH): 84 mg/dL (ref 0–99)
Triglycerides: 78 mg/dL (ref 0–149)
VLDL Cholesterol Cal: 15 mg/dL (ref 5–40)

## 2021-06-14 LAB — CARDIOVASCULAR RISK ASSESSMENT

## 2021-07-04 ENCOUNTER — Telehealth: Payer: Medicare Other

## 2021-07-10 ENCOUNTER — Other Ambulatory Visit: Payer: Self-pay | Admitting: Legal Medicine

## 2021-07-10 NOTE — Telephone Encounter (Signed)
Refill sent to pharmacy.   

## 2021-07-17 ENCOUNTER — Telehealth: Payer: Self-pay

## 2021-07-17 NOTE — Chronic Care Management (AMB) (Signed)
Chronic Care Management Pharmacy Assistant   Name: Victoria Barajas  MRN: [id number] DOB: 07-Sep-1946   Reason for Encounter: Disease State/ Hypertension   Recent office visits:  06-13-2021 Abigail Miyamoto, MD. Glucose= 102, Albumin/Globulin ratio= 2.3.  Recent consult visits:  None  Hospital visits:  None in previous 6 months  Medications: Outpatient Encounter Medications as of 07/17/2021  Medication Sig   aspirin 81 MG EC tablet Take 81 mg by mouth daily. Swallow whole.   aspirin-acetaminophen-caffeine (EXCEDRIN MIGRAINE) 250-250-65 MG tablet Take 1 tablet by mouth every 6 (six) hours as needed.   atorvastatin (LIPITOR) 40 MG tablet Take 1 tablet (40 mg total) by mouth daily.   lisinopril (ZESTRIL) 20 MG tablet Take 1 tablet (20 mg total) by mouth daily.   naproxen sodium (ALEVE) 220 MG tablet Take 220 mg by mouth daily as needed.    nitroGLYCERIN (NITROSTAT) 0.4 MG SL tablet Place 1 tablet (0.4 mg total) under the tongue every 5 (five) minutes as needed.   omeprazole (PRILOSEC) 40 MG capsule TAKE 1 CAPSULE BY MOUTH EVERY NIGHT AT BEDTIME   polyethylene glycol (MIRALAX / GLYCOLAX) packet Take 17 g by mouth daily as needed.   No facility-administered encounter medications on file as of 07/17/2021.    Recent Office Vitals: BP Readings from Last 3 Encounters:  06/13/21 (!) 146/82  12/09/20 132/70  09/09/20 130/80   Pulse Readings from Last 3 Encounters:  06/13/21 69  12/09/20 75  09/09/20 76    Wt Readings from Last 3 Encounters:  06/13/21 219 lb (99.3 kg)  12/09/20 220 lb 9.6 oz (100.1 kg)  09/09/20 218 lb (98.9 kg)     Kidney Function Lab Results  Component Value Date/Time   CREATININE 0.98 06/13/2021 10:23 AM   CREATININE 1.01 (H) 12/09/2020 10:28 AM   GFRNONAA 60 09/09/2020 09:57 AM   GFRAA 70 09/09/2020 09:57 AM    BMP Latest Ref Rng & Units 06/13/2021 12/09/2020 09/09/2020  Glucose 65 - 99 mg/dL 683(M) 196(Q) 229(N)  BUN 8 - 27 mg/dL 14 13 21    Creatinine 0.57 - 1.00 mg/dL 9.89) 2.11(H  BUN/Creat Ratio 12 - 28 14 13 22   Sodium 134 - 144 mmol/L 144 143 141  Potassium 3.5 - 5.2 mmol/L 4.4 4.5 4.7  Chloride 96 - 106 mmol/L 106 106 105  CO2 20 - 29 mmol/L 24 20 22   Calcium 8.7 - 10.3 mg/dL 4.17 9.9     Current antihypertensive regimen:  Lisinopril 20 mg daily  Patient verbally confirms she is taking the above medications as directed. Yes  How often are you checking your Blood Pressure? infrequently  she checks her blood pressure in the morning before taking her medication.  Current home BP readings: 130/80 Wrist or arm cuff: Cuff Caffeine intake: 2 cups of coffee daily Salt intake: limited OTC medications including pseudoephedrine or NSAIDs? None  Any readings above 180/120? No  What recent interventions/DTPs have been made by any provider to improve Blood Pressure control since last CPP Visit:  Ensure daily salt intake < 2300 mg/day Check BP as needed, document, and provide at future appointments  Any recent hospitalizations or ED visits since last visit with CPP? No  What diet changes have been made to improve Blood Pressure Control?  Patient states she tries to eat vegetables and fruit but could eat more. Patient states she limits her salt intake and drinks water daily.  What exercise is being done to improve your Blood Pressure Control?  Patient states she cleans around the house daily.  Adherence Review: Is the patient currently on ACE/ARB medication? Yes Does the patient have >5 day gap between last estimated fill dates? CPP to review  Care Gaps: Last annual wellness visit? 08-31-2020 canceled Tdap overdue last completed 08-22-2010 Shingrix overdue Covid vaccine overdue last completed 01-08-2020 Hep C screening overdue Flu vaccine overdue last completed 08-28-2019 BP 06-13-21 146/82  Star Rating Drugs:  Atorvastatin 40 mg - Last filled 06-06-2021 90 DS Lisinopril 20 mg- Last filled  05-05-2021 90 DS   [facility] CMA Clinical Pharmacist Assistant [phone]

## 2021-07-25 NOTE — Telephone Encounter (Signed)
Patient missed AWV last year, will try to get set up before Jan

## 2021-08-09 ENCOUNTER — Other Ambulatory Visit: Payer: Self-pay | Admitting: Legal Medicine

## 2021-08-09 DIAGNOSIS — I10 Essential (primary) hypertension: Secondary | ICD-10-CM

## 2021-10-13 ENCOUNTER — Telehealth: Payer: Medicare Other

## 2021-10-16 DIAGNOSIS — H11153 Pinguecula, bilateral: Secondary | ICD-10-CM | POA: Diagnosis not present

## 2021-10-16 DIAGNOSIS — H5213 Myopia, bilateral: Secondary | ICD-10-CM | POA: Diagnosis not present

## 2021-10-16 DIAGNOSIS — H02831 Dermatochalasis of right upper eyelid: Secondary | ICD-10-CM | POA: Diagnosis not present

## 2021-10-16 DIAGNOSIS — H524 Presbyopia: Secondary | ICD-10-CM | POA: Diagnosis not present

## 2021-10-16 DIAGNOSIS — H52203 Unspecified astigmatism, bilateral: Secondary | ICD-10-CM | POA: Diagnosis not present

## 2021-10-16 DIAGNOSIS — H02834 Dermatochalasis of left upper eyelid: Secondary | ICD-10-CM | POA: Diagnosis not present

## 2021-10-16 DIAGNOSIS — H2513 Age-related nuclear cataract, bilateral: Secondary | ICD-10-CM | POA: Diagnosis not present

## 2021-10-19 ENCOUNTER — Telehealth: Payer: Self-pay

## 2021-10-19 NOTE — Chronic Care Management (AMB) (Signed)
10/19/21- Called and lvm to call back to reschedule missed appt with CPP.  Roxana Hires, CMA Clinical Pharmacist Assistant  [phone]

## 2021-11-01 ENCOUNTER — Telehealth: Payer: Self-pay

## 2021-11-01 NOTE — Progress Notes (Signed)
° ° °  Chronic Care Management Pharmacy Assistant   Name: Victoria Barajas  MRN: [id number] DOB: 1946/05/20   Reason for Encounter: Disease State call for HTN   Recent office visits:  None  Recent consult visits:  None  Hospital visits:  None  Medications: Outpatient Encounter Medications as of 11/01/2021  Medication Sig   aspirin 81 MG EC tablet Take 81 mg by mouth daily. Swallow whole.   aspirin-acetaminophen-caffeine (EXCEDRIN MIGRAINE) 250-250-65 MG tablet Take 1 tablet by mouth every 6 (six) hours as needed.   atorvastatin (LIPITOR) 40 MG tablet Take 1 tablet (40 mg total) by mouth daily.   lisinopril (ZESTRIL) 20 MG tablet TAKE 1 TABLET BY MOUTH EVERY DAY   naproxen sodium (ALEVE) 220 MG tablet Take 220 mg by mouth daily as needed.    nitroGLYCERIN (NITROSTAT) 0.4 MG SL tablet Place 1 tablet (0.4 mg total) under the tongue every 5 (five) minutes as needed.   omeprazole (PRILOSEC) 40 MG capsule TAKE 1 CAPSULE BY MOUTH EVERY NIGHT AT BEDTIME   polyethylene glycol (MIRALAX / GLYCOLAX) packet Take 17 g by mouth daily as needed.   No facility-administered encounter medications on file as of 11/01/2021.     Recent Office Vitals: BP Readings from Last 3 Encounters:  06/13/21 (!) 146/82  12/09/20 132/70  09/09/20 130/80   Pulse Readings from Last 3 Encounters:  06/13/21 69  12/09/20 75  09/09/20 76    Wt Readings from Last 3 Encounters:  06/13/21 219 lb (99.3 kg)  12/09/20 220 lb 9.6 oz (100.1 kg)  09/09/20 218 lb (98.9 kg)     Kidney Function Lab Results  Component Value Date/Time   CREATININE 0.98 06/13/2021 10:23 AM   CREATININE 1.01 (H) 12/09/2020 10:28 AM   GFRNONAA 60 09/09/2020 09:57 AM   GFRAA 70 09/09/2020 09:57 AM    BMP Latest Ref Rng & Units 06/13/2021 12/09/2020 09/09/2020  Glucose 65 - 99 mg/dL 163(A) 453(M) 468(E)  BUN 8 - 27 mg/dL 14 13 21   Creatinine 0.57 - 1.00 mg/dL 3.21) 2.24(M  BUN/Creat Ratio 12 - 28 14 13 22   Sodium 134 - 144 mmol/L 144  143 141  Potassium 3.5 - 5.2 mmol/L 4.4 4.5 4.7  Chloride 96 - 106 mmol/L 106 106 105  CO2 20 - 29 mmol/L 24 20 22   Calcium 8.7 - 10.3 mg/dL 2.50 9.9   R/S Canceled Appt   Current antihypertensive regimen:  Lisinopril 20 mg dail   Wrist or arm cuff:Arm  Caffeine intake:2 cups of coffee daily Salt intake:Limited    Adherence Review: Is the patient currently on ACE/ARB medication? Yes Does the patient have >5 day gap between last estimated fill dates? CPP to review  Care Gaps: Last annual wellness visit? None noted - Message was already sent to LPN  Star Rating Drugs:  Medication:  Last Fill: Day Supply Lisinopril   08/09/21 90ds    05/05/21 90ds  Victoria Barajas, CMA Clinical Pharmacist Assistant  [phone]   I have made several attempts and have been unsuccessful at completing this call

## 2021-12-19 ENCOUNTER — Other Ambulatory Visit: Payer: Self-pay

## 2021-12-19 ENCOUNTER — Encounter: Payer: Self-pay | Admitting: Legal Medicine

## 2021-12-19 ENCOUNTER — Ambulatory Visit (INDEPENDENT_AMBULATORY_CARE_PROVIDER_SITE_OTHER): Payer: Medicare Other | Admitting: Legal Medicine

## 2021-12-19 VITALS — BP 140/90 | HR 72 | Temp 98.8°F | Resp 15 | Ht 66.0 in | Wt 224.0 lb

## 2021-12-19 DIAGNOSIS — Z6835 Body mass index (BMI) 35.0-35.9, adult: Secondary | ICD-10-CM

## 2021-12-19 DIAGNOSIS — I10 Essential (primary) hypertension: Secondary | ICD-10-CM | POA: Diagnosis not present

## 2021-12-19 DIAGNOSIS — N3941 Urge incontinence: Secondary | ICD-10-CM

## 2021-12-19 DIAGNOSIS — R002 Palpitations: Secondary | ICD-10-CM | POA: Diagnosis not present

## 2021-12-19 DIAGNOSIS — M810 Age-related osteoporosis without current pathological fracture: Secondary | ICD-10-CM | POA: Diagnosis not present

## 2021-12-19 DIAGNOSIS — N3000 Acute cystitis without hematuria: Secondary | ICD-10-CM

## 2021-12-19 DIAGNOSIS — E782 Mixed hyperlipidemia: Secondary | ICD-10-CM

## 2021-12-19 DIAGNOSIS — K219 Gastro-esophageal reflux disease without esophagitis: Secondary | ICD-10-CM | POA: Diagnosis not present

## 2021-12-19 HISTORY — DX: Palpitations: R00.2

## 2021-12-19 LAB — POCT URINALYSIS DIP (CLINITEK)
Bilirubin, UA: NEGATIVE
Blood, UA: NEGATIVE
Glucose, UA: NEGATIVE mg/dL
Ketones, POC UA: NEGATIVE mg/dL
Nitrite, UA: NEGATIVE
POC PROTEIN,UA: NEGATIVE
Spec Grav, UA: 1.01 (ref 1.010–1.025)
Urobilinogen, UA: 0.2 E.U./dL
pH, UA: 7.5 (ref 5.0–8.0)

## 2021-12-19 NOTE — Addendum Note (Signed)
Addended by: Tawny Asal I on: 12/19/2021 04:13 PM ? ? Modules accepted: Orders ? ?

## 2021-12-19 NOTE — Progress Notes (Signed)
Subjective:  Patient ID: Victoria Barajas, female    DOB: [birth year]  Age: 76 y.o. MRN: [id number]  Chief Complaint  Patient presents with   Hypertension   Hyperlipidemia    HPI Hypertension: Patient is taking Lisinopril 20 mg daily, aspirin 81 mg daily. Patient presents for follow up of hypertension.  Patient tolerating lisinopril well with side effects.  Patient was diagnosed with hypertension 2010 so has been treated for hypertension for 12 years.Patient is working on maintaining diet and exercise regimen and follows up as directed. Complication include none .  Patient presents with hyperlipidemia.  Compliance with treatment has been good; patient takes medicines as directed, maintains low cholesterol diet, follows up as directed, and maintains exercise regimen.  Patient is using atorvastatin without problems.   GERD: Currently taking Omeprazole 40 mg daily.  Patient have DOE and some chest pain relieved by NTG, last seen Dr. Wille Glaser 2020.  Needs to see. Current Outpatient Medications on File Prior to Visit  Medication Sig Dispense Refill   aspirin 81 MG EC tablet Take 81 mg by mouth daily. Swallow whole.     aspirin-acetaminophen-caffeine (EXCEDRIN MIGRAINE) 250-250-65 MG tablet Take 1 tablet by mouth every 6 (six) hours as needed.     atorvastatin (LIPITOR) 40 MG tablet Take 1 tablet (40 mg total) by mouth daily. 90 tablet 2   lisinopril (ZESTRIL) 20 MG tablet TAKE 1 TABLET BY MOUTH EVERY DAY 90 tablet 2   naproxen sodium (ALEVE) 220 MG tablet Take 220 mg by mouth daily as needed.      nitroGLYCERIN (NITROSTAT) 0.4 MG SL tablet Place 1 tablet (0.4 mg total) under the tongue every 5 (five) minutes as needed. 25 tablet 11   omeprazole (PRILOSEC) 40 MG capsule TAKE 1 CAPSULE BY MOUTH EVERY NIGHT AT BEDTIME 90 capsule 2   polyethylene glycol (MIRALAX / GLYCOLAX) packet Take 17 g by mouth daily as needed.     No current facility-administered medications on file prior to visit.   Past  Medical History:  Diagnosis Date   Acid reflux    High cholesterol    Migraine    Past Surgical History:  Procedure Laterality Date   ABDOMINAL HYSTERECTOMY     TUBAL LIGATION     WRIST SURGERY      Family History  Problem Relation Age of Onset   Heart Problems Mother    Diabetes Father    Diabetes Sister    Kidney disease Sister    Diabetes Brother    Alzheimer's disease Brother    Heart attack Brother    Social History   Socioeconomic History   Marital status: Married    Spouse name: Not on file   Number of children: Not on file   Years of education: Not on file   Highest education level: Not on file  Occupational History   Not on file  Tobacco Use   Smoking status: Former    Types: Cigarettes    Quit date: 1993    Years since quitting: 30.2   Smokeless tobacco: Never  Substance and Sexual Activity   Alcohol use: No   Drug use: No   Sexual activity: Not Currently  Other Topics Concern   Not on file  Social History Narrative   Not on file   Social Determinants of Health   Financial Resource Strain: Not on file  Food Insecurity: Not on file  Transportation Needs: Not on file  Physical Activity: Not on file  Stress: Not on  file  Social Connections: Not on file    Review of Systems  Constitutional:  Negative for chills, fatigue and fever.  HENT:  Positive for rhinorrhea and sinus pressure. Negative for congestion, ear pain and sore throat.   Eyes:  Negative for visual disturbance.  Respiratory:  Negative for cough and shortness of breath.   Cardiovascular:  Negative for chest pain and palpitations.  Gastrointestinal:  Negative for abdominal pain, constipation, diarrhea, nausea and vomiting.  Endocrine: Negative for polydipsia, polyphagia and polyuria.  Genitourinary:  Negative for difficulty urinating and dysuria.  Musculoskeletal:  Negative for arthralgias, back pain and myalgias.  Skin:  Negative for rash.  Neurological:  Positive for headaches.   Psychiatric/Behavioral:  Negative for dysphoric mood. The patient is not nervous/anxious.     Objective:  BP 140/90   Pulse 72   Temp 98.8 F (37.1 C)   Resp 15   Ht 5\' 6"  (1.676 m)   Wt 224 lb (101.6 kg)   SpO2 97%   BMI 36.15 kg/m   BP/Weight 12/19/2021 06/13/2021 12/09/2020  Systolic BP 140 146 132  Diastolic BP 90 82 70  Wt. (Lbs) 224 219 220.6  BMI 36.15 35.35 35.61    Physical Exam Vitals reviewed.  Constitutional:      General: She is not in acute distress.    Appearance: Normal appearance. She is obese.  HENT:     Head: Normocephalic.     Right Ear: Tympanic membrane normal.     Left Ear: Tympanic membrane normal.     Nose: Nose normal.     Mouth/Throat:     Mouth: Mucous membranes are moist.  Eyes:     Extraocular Movements: Extraocular movements intact.     Conjunctiva/sclera: Conjunctivae normal.     Pupils: Pupils are equal, round, and reactive to light.  Cardiovascular:     Rate and Rhythm: Normal rate and regular rhythm.     Pulses: Normal pulses.     Heart sounds: Normal heart sounds. No murmur heard.   No gallop.  Pulmonary:     Effort: No respiratory distress.     Breath sounds: Normal breath sounds. No wheezing.  Abdominal:     General: Abdomen is flat. Bowel sounds are normal. There is no distension.     Tenderness: There is no abdominal tenderness.  Musculoskeletal:        General: Normal range of motion.     Cervical back: Normal range of motion and neck supple.     Right lower leg: No edema.     Left lower leg: No edema.  Skin:    General: Skin is warm.     Capillary Refill: Capillary refill takes less than 2 seconds.  Neurological:     General: No focal deficit present.     Mental Status: She is alert and oriented to person, place, and time. Mental status is at baseline.     Gait: Gait normal.     Deep Tendon Reflexes: Reflexes normal.  Psychiatric:        Mood and Affect: Mood normal.        Thought Content: Thought content normal.         Lab Results  Component Value Date   WBC 4.9 06/13/2021   HGB 13.2 06/13/2021   HCT 39.6 06/13/2021   PLT 225 06/13/2021   GLUCOSE 102 (H) 06/13/2021   CHOL 149 06/13/2021   TRIG 78 06/13/2021   HDL 50 06/13/2021   LDLCALC 84  06/13/2021   ALT 19 06/13/2021   AST 21 06/13/2021   NA 144 06/13/2021   K 4.4 06/13/2021   CL 106 06/13/2021   CREATININE 0.98 06/13/2021   BUN 14 06/13/2021   CO2 24 06/13/2021   TSH 2.090 09/09/2020   INR 0.95 11/08/2017      Assessment & Plan:   Problem List Items Addressed This Visit       Cardiovascular and Mediastinum   Essential hypertension - Primary   Relevant Orders   Comprehensive metabolic panel   CBC with Differential/Platelet An individual hypertension care plan was established and reinforced today.  The patient's status was assessed using clinical findings on exam and labs or diagnostic tests. The patient's success at meeting treatment goals on disease specific evidence-based guidelines and found to be well controlled. SELF MANAGEMENT: The patient and I together assessed ways to personally work towards obtaining the recommended goals. RECOMMENDATIONS: avoid decongestants found in common cold remedies, decrease consumption of alcohol, perform routine monitoring of BP with home BP cuff, exercise, reduction of dietary salt, take medicines as prescribed, try not to miss doses and quit smoking.  Regular exercise and maintaining a healthy weight is needed.  Stress reduction may help. A CLINICAL SUMMARY including written plan identify barriers to care unique to individual due to social or financial issues.  We attempt to mutually creat solutions for individual and family understanding.      Digestive   Gastroesophageal reflux disease without esophagitis Plan of care was formulated today.  She is doing well.  A plan of care was formulated using patient exam, tests and other sources to optimize care using evidence based information.   Recommend no smoking, no eating after supper, avoid fatty foods, elevate Head of bed, avoid tight fitting clothing.  Continue on omeprazole.      Musculoskeletal and Integument   Osteoporosis   Relevant Orders   VITAMIN D 25 Hydroxy (Vit-D Deficiency, Fractures) Patient has history of low vitamin D     Other   Mixed hyperlipidemia   Relevant Orders   Lipid panel AN INDIVIDUAL CARE PLAN for hyperlipidemia/ fair was established and reinforced today.  The patient's status was assessed using clinical findings on exam, lab and other diagnostic tests. The patient's disease status was assessed based on evidence-based guidelines and found to be fair controlled. MEDICATIONS were reviewed. SELF MANAGEMENT GOALS have been discussed and patient's success at attaining the goal of low cholesterol was assessed. RECOMMENDATION given include regular exercise 3 days a week and low cholesterol/low fat diet. CLINICAL SUMMARY including written plan to identify barriers unique to the patient due to social or economic  reasons was discussed.     BMI 36.0-36.9,adult An individualize plan was formulated for obesity using patient history and physical exam to encourage weight loss.  An evidence based program was formulated.  Patient is to cut portion size with meals and to plan physical exercise 3 days a week at least 20 minutes.  Weight watchers and other programs are helpful.  Planned amount of weight loss 15 lbs. With hypertension and hypercholesterolemia, she meets criteria for morbid obesity    Morbid obesity (HCC) An individualize plan was formulated for obesity using patient history and physical exam to encourage weight loss.  An evidence based program was formulated.  Patient is to cut portion size with meals and to plan physical exercise 3 days a week at least 20 minutes.  Weight watchers and other programs are helpful.  Planned amount of  weight loss 15 lbs.     Urgency incontinence   Relevant Orders   POCT  URINALYSIS DIP (CLINITEK) (Completed) Patient has improved on medicines   Palpitations   Relevant Orders   Ambulatory referral to Cardiology Patient is having DOE worse for last 2 months and some palpitations and precordial pain.  . A total of 35 minutes were spent face-to-face with the patient during this encounter and over half of that time was spent on counseling and coordination of care.        Follow-up: Return in about 6 months (around 06/21/2022) for fasting.  An After Visit Summary was printed and given to the patient.  Brent Bulla, MD [facility] [phone]

## 2021-12-20 ENCOUNTER — Other Ambulatory Visit: Payer: Self-pay

## 2021-12-20 ENCOUNTER — Other Ambulatory Visit: Payer: Self-pay | Admitting: Legal Medicine

## 2021-12-20 DIAGNOSIS — E782 Mixed hyperlipidemia: Secondary | ICD-10-CM

## 2021-12-20 DIAGNOSIS — E7800 Pure hypercholesterolemia, unspecified: Secondary | ICD-10-CM | POA: Insufficient documentation

## 2021-12-20 LAB — CBC WITH DIFFERENTIAL/PLATELET
Basophils Absolute: 0 10*3/uL (ref 0.0–0.2)
Basos: 1 %
EOS (ABSOLUTE): 0.1 10*3/uL (ref 0.0–0.4)
Eos: 1 %
Hematocrit: 42.4 % (ref 34.0–46.6)
Hemoglobin: 14.1 g/dL (ref 11.1–15.9)
Immature Grans (Abs): 0 10*3/uL (ref 0.0–0.1)
Immature Granulocytes: 0 %
Lymphocytes Absolute: 1.2 10*3/uL (ref 0.7–3.1)
Lymphs: 25 %
MCH: 29.2 pg (ref 26.6–33.0)
MCHC: 33.3 g/dL (ref 31.5–35.7)
MCV: 88 fL (ref 79–97)
Monocytes Absolute: 0.4 10*3/uL (ref 0.1–0.9)
Monocytes: 9 %
Neutrophils Absolute: 3.2 10*3/uL (ref 1.4–7.0)
Neutrophils: 64 %
Platelets: 226 10*3/uL (ref 150–450)
RBC: 4.83 x10E6/uL (ref 3.77–5.28)
RDW: 12.4 % (ref 11.7–15.4)
WBC: 4.9 10*3/uL (ref 3.4–10.8)

## 2021-12-20 LAB — COMPREHENSIVE METABOLIC PANEL
ALT: 30 IU/L (ref 0–32)
AST: 23 IU/L (ref 0–40)
Albumin/Globulin Ratio: 2.8 — ABNORMAL HIGH (ref 1.2–2.2)
Albumin: 4.8 g/dL — ABNORMAL HIGH (ref 3.7–4.7)
Alkaline Phosphatase: 79 IU/L (ref 44–121)
BUN/Creatinine Ratio: 21 (ref 12–28)
BUN: 19 mg/dL (ref 8–27)
Bilirubin Total: 0.9 mg/dL (ref 0.0–1.2)
CO2: 25 mmol/L (ref 20–29)
Calcium: 9.9 mg/dL (ref 8.7–10.3)
Chloride: 105 mmol/L (ref 96–106)
Creatinine, Ser: 0.92 mg/dL (ref 0.57–1.00)
Globulin, Total: 1.7 g/dL (ref 1.5–4.5)
Glucose: 102 mg/dL — ABNORMAL HIGH (ref 70–99)
Potassium: 4.7 mmol/L (ref 3.5–5.2)
Sodium: 142 mmol/L (ref 134–144)
Total Protein: 6.5 g/dL (ref 6.0–8.5)
eGFR: 65 mL/min/{1.73_m2} (ref >59)

## 2021-12-20 LAB — LIPID PANEL
Chol/HDL Ratio: 4.2 ratio (ref 0.0–4.4)
Cholesterol, Total: 208 mg/dL — ABNORMAL HIGH (ref 100–199)
HDL: 50 mg/dL (ref >39)
LDL Chol Calc (NIH): 140 mg/dL — ABNORMAL HIGH (ref 0–99)
Triglycerides: 100 mg/dL (ref 0–149)
VLDL Cholesterol Cal: 18 mg/dL (ref 5–40)

## 2021-12-20 LAB — CARDIOVASCULAR RISK ASSESSMENT

## 2021-12-20 LAB — VITAMIN D 25 HYDROXY (VIT D DEFICIENCY, FRACTURES): Vit D, 25-Hydroxy: 19.1 ng/mL — ABNORMAL LOW (ref 30.0–100.0)

## 2021-12-20 MED ORDER — ROSUVASTATIN CALCIUM 20 MG PO TABS: 20.0000 mg | ORAL_TABLET | Freq: Every day | ORAL | 3 refills | Status: DC

## 2021-12-20 NOTE — Progress Notes (Signed)
Changed atorvastatin to rosuvastatin 20mg  for cholesterol ?lp

## 2021-12-20 NOTE — Progress Notes (Signed)
Glucose  102, kidney and liver tests normal, LDL cholesterol high 140- is he taking his atorvastatin?, Vitamin D is low, take 2000 units qd, CBC normal ?lp

## 2021-12-21 LAB — URINE CULTURE

## 2021-12-21 NOTE — Progress Notes (Signed)
Culture E. Coli sensitive to macrobid ?lp

## 2021-12-22 ENCOUNTER — Ambulatory Visit: Payer: Medicare Other | Admitting: Cardiology

## 2021-12-22 ENCOUNTER — Encounter: Payer: Self-pay | Admitting: Cardiology

## 2021-12-22 ENCOUNTER — Other Ambulatory Visit: Payer: Self-pay | Admitting: Legal Medicine

## 2021-12-22 ENCOUNTER — Ambulatory Visit (INDEPENDENT_AMBULATORY_CARE_PROVIDER_SITE_OTHER): Payer: Medicare Other

## 2021-12-22 ENCOUNTER — Other Ambulatory Visit: Payer: Self-pay

## 2021-12-22 VITALS — BP 146/82 | HR 74 | Ht 66.0 in | Wt 224.0 lb

## 2021-12-22 DIAGNOSIS — R0609 Other forms of dyspnea: Secondary | ICD-10-CM

## 2021-12-22 DIAGNOSIS — E782 Mixed hyperlipidemia: Secondary | ICD-10-CM

## 2021-12-22 DIAGNOSIS — R002 Palpitations: Secondary | ICD-10-CM

## 2021-12-22 DIAGNOSIS — Z6836 Body mass index (BMI) 36.0-36.9, adult: Secondary | ICD-10-CM

## 2021-12-22 DIAGNOSIS — N3000 Acute cystitis without hematuria: Secondary | ICD-10-CM

## 2021-12-22 DIAGNOSIS — I10 Essential (primary) hypertension: Secondary | ICD-10-CM

## 2021-12-22 DIAGNOSIS — E7800 Pure hypercholesterolemia, unspecified: Secondary | ICD-10-CM | POA: Diagnosis not present

## 2021-12-22 HISTORY — DX: Other forms of dyspnea: R06.09

## 2021-12-22 MED ORDER — NITROFURANTOIN MONOHYD MACRO 100 MG PO CAPS: 100.0000 mg | ORAL_CAPSULE | Freq: Two times a day (BID) | ORAL | 0 refills | Status: DC

## 2021-12-22 NOTE — Progress Notes (Signed)
?Cardiology Office Note:   ? ?Date:  12/22/2021  ? ?ID:  Victoria Barajas, DOB 1946/04/18, MRN [id number] ? ?PCP:  Abigail Miyamoto, MD  ?Cardiologist:  Garwin Brothers, MD  ? ?Referring MD: Abigail Miyamoto,*  ? ? ?ASSESSMENT:   ? ?1. Essential hypertension   ?2. High cholesterol   ?3. Mixed hyperlipidemia   ?4. BMI 36.0-36.9,adult   ?5. Palpitations   ?6. Dyspnea on exertion   ? ?PLAN:   ? ?In order of problems listed above: ? ?Primary prevention stressed with the patient.  Importance of compliance with diet medication stressed and she vocalized understanding. ?Dyspnea on exertion: This appears to be concerning and we would like to evaluate her for any objective evidence of coronary artery disease that may be causing the symptoms.  She will have a Lexiscan sestamibi.  It is to be noted that she has left bundle branch block. ?Cardiac murmur: Echocardiogram will be done to assess murmur heard on auscultation. ?Palpitations: We will do a 2-week monitor to understand her symptoms.  I also told her to consider buying a cardia app and we gave her the details of this. ?Mixed dyslipidemia: Lipids were reviewed and diet emphasized.  She promises to do better with diet. ?Obesity: Weight reduction stressed.  Risks of obesity explained and she promises to do better. ?Patient will be seen in follow-up appointment in 6 months or earlier if the patient has any concerns ? ? ? ?Medication Adjustments/Labs and Tests Ordered: ?Current medicines are reviewed at length with the patient today.  Concerns regarding medicines are outlined above.  ?No orders of the defined types were placed in this encounter. ? ?No orders of the defined types were placed in this encounter. ? ? ? ?No chief complaint on file. ?  ? ?History of Present Illness:   ? ?Victoria Barajas is a 76 y.o. female.  Patient has past medical history of essential hypertension, dyslipidemia and obesity.  Overall she leads a sedentary lifestyle.  I had seen her about 3  years ago.  Now she is back complaining of palpitations on and off.  She also has some dyspnea on exertion.  She is concerned about it and wants an evaluation.  At the time of my evaluation, the patient is alert awake oriented and in no distress.  No dizziness or any syncopal spells. ? ?Past Medical History:  ?Diagnosis Date  ? Age-related nuclear cataract of both eyes 01/06/2016  ? BMI 36.0-36.9,adult 03/11/2020  ? Essential hypertension 11/25/2017  ? Gastroesophageal reflux disease without esophagitis 01/06/2016  ? High cholesterol   ? High risk medication use 01/06/2016  ? LVH (left ventricular hypertrophy) 03/11/2020  ? Migraine headache 01/06/2016  ? Mixed hyperlipidemia 01/06/2016  ? Morbid obesity (HCC) 09/09/2020  ? Myopia of both eyes 01/06/2016  ? Osteoporosis 01/06/2016  ? Palpitations 12/19/2021  ? Plantar fasciitis, left 01/06/2016  ? Presbyopia of both eyes 01/06/2016  ? Rectocele 05/23/2016  ? Rupture of flexor tendon of finger 10/28/2020  ? Spondylosis with myelopathy, lumbar region 11/12/2019  ? Urgency incontinence 06/13/2021  ? ? ?Past Surgical History:  ?Procedure Laterality Date  ? ABDOMINAL HYSTERECTOMY    ? TUBAL LIGATION    ? WRIST SURGERY    ? ? ?Current Medications: ?Current Meds  ?Medication Sig  ? aspirin 81 MG EC tablet Take 81 mg by mouth daily. Swallow whole.  ? aspirin-acetaminophen-caffeine (EXCEDRIN MIGRAINE) 250-250-65 MG tablet Take 1 tablet by mouth every 6 (six) hours as needed for migraine.  ?  lisinopril (ZESTRIL) 20 MG tablet TAKE 1 TABLET BY MOUTH EVERY DAY  ? naproxen sodium (ALEVE) 220 MG tablet Take 220 mg by mouth daily as needed for headache.  ? nitrofurantoin, macrocrystal-monohydrate, (MACROBID) 100 MG capsule Take 1 capsule (100 mg total) by mouth 2 (two) times daily.  ? nitroGLYCERIN (NITROSTAT) 0.4 MG SL tablet Place 0.4 mg under the tongue every 5 (five) minutes as needed for chest pain.  ? omeprazole (PRILOSEC) 40 MG capsule TAKE 1 CAPSULE BY MOUTH EVERY NIGHT AT BEDTIME  ?  polyethylene glycol (MIRALAX / GLYCOLAX) packet Take 17 g by mouth daily as needed for diarrhea or loose stools.  ? rosuvastatin (CRESTOR) 20 MG tablet Take 1 tablet (20 mg total) by mouth daily.  ?  ? ?Allergies:   Prolia [denosumab], Alendronate, and Tape  ? ?Social History  ? ?Socioeconomic History  ? Marital status: Married  ?  Spouse name: Not on file  ? Number of children: Not on file  ? Years of education: Not on file  ? Highest education level: Not on file  ?Occupational History  ? Not on file  ?Tobacco Use  ? Smoking status: Former  ?  Types: Cigarettes  ?  Quit date: 52  ?  Years since quitting: 30.2  ? Smokeless tobacco: Never  ?Substance and Sexual Activity  ? Alcohol use: No  ? Drug use: No  ? Sexual activity: Not Currently  ?Other Topics Concern  ? Not on file  ?Social History Narrative  ? Not on file  ? ?Social Determinants of Health  ? ?Financial Resource Strain: Not on file  ?Food Insecurity: Not on file  ?Transportation Needs: Not on file  ?Physical Activity: Not on file  ?Stress: Not on file  ?Social Connections: Not on file  ?  ? ?Family History: ?The patient's family history includes Alzheimer's disease in her brother; Diabetes in her brother, father, and sister; Heart Problems in her mother; Heart attack in her brother; Kidney disease in her sister. ? ?ROS:   ?Please see the history of present illness.    ?All other systems reviewed and are negative. ? ?EKGs/Labs/Other Studies Reviewed:   ? ?The following studies were reviewed today: ?I discussed my findings with the patient at length.  EKG reveals sinus rhythm and left bundle branch block. ? ? ?Recent Labs: ?12/19/2021: ALT 30; BUN 19; Creatinine, Ser 0.92; Hemoglobin 14.1; Platelets 226; Potassium 4.7; Sodium 142  ?Recent Lipid Panel ?   ?Component Value Date/Time  ? CHOL 208 (H) 12/19/2021 1059  ? TRIG 100 12/19/2021 1059  ? HDL 50 12/19/2021 1059  ? CHOLHDL 4.2 12/19/2021 1059  ? LDLCALC 140 (H) 12/19/2021 1059  ? ? ?Physical Exam:    ? ?VS:  BP (!) 146/82   Pulse 74   Ht 5\' 6"  (1.676 m)   Wt 224 lb (101.6 kg)   SpO2 99%   BMI 36.15 kg/m?    ? ?Wt Readings from Last 3 Encounters:  ?12/22/21 224 lb (101.6 kg)  ?12/19/21 224 lb (101.6 kg)  ?06/13/21 219 lb (99.3 kg)  ?  ? ?GEN: Patient is in no acute distress ?HEENT: Normal ?NECK: No JVD; No carotid bruits ?LYMPHATICS: No lymphadenopathy ?CARDIAC: Hear sounds regular, 2/6 systolic murmur at the apex. ?RESPIRATORY:  Clear to auscultation without rales, wheezing or rhonchi  ?ABDOMEN: Soft, non-tender, non-distended ?MUSCULOSKELETAL:  No edema; No deformity  ?SKIN: Warm and dry ?NEUROLOGIC:  Alert and oriented x 3 ?PSYCHIATRIC:  Normal affect  ? ?Signed, ?08/13/21  Jones Broom Anely Spiewak, MD  ?12/22/2021 3:50 PM    ?[facility]  ?

## 2021-12-22 NOTE — Patient Instructions (Signed)
Medication Instructions:  ?Your physician recommends that you continue on your current medications as directed. Please refer to the Current Medication list given to you today. ? ?*If you need a refill on your cardiac medications before your next appointment, please call your pharmacy* ? ? ?Lab Work: ?None ordered ?If you have labs (blood work) drawn today and your tests are completely normal, you will receive your results only by: ?MyChart Message (if you have MyChart) OR ?A paper copy in the mail ?If you have any lab test that is abnormal or we need to change your treatment, we will call you to review the results. ? ? ?Testing/Procedures: ?Your physician has requested that you have a lexiscan myoview. For further information please visit [email or web address] Please follow instruction sheet, as given. ? ?The test will take approximately 3 to 4 hours to complete; you may bring reading material.  If someone comes with you to your appointment, they will need to remain in the main lobby due to limited space in the testing area.  ? ?How to prepare for your Myocardial Perfusion Test: ?Do not eat or drink 3 hours prior to your test, except you may have water. ?Do not consume products containing caffeine (regular or decaffeinated) 12 hours prior to your test. (ex: coffee, chocolate, sodas, tea). ?Do bring a list of your current medications with you.  If not listed below, you may take your medications as normal. ?Do wear comfortable clothes (no dresses or overalls) and walking shoes, tennis shoes preferred (No heels or open toe shoes are allowed). ?Do NOT wear cologne, perfume, aftershave, or lotions (deodorant is allowed). ?If these instructions are not followed, your test will have to be rescheduled. ? ?Your physician has requested that you have an echocardiogram. Echocardiography is a painless test that uses sound waves to create images of your heart. It provides your doctor with information about the size and shape of  your heart and how well your heart?s chambers and valves are working. This procedure takes approximately one hour. There are no restrictions for this procedure. ? ? ?WHY IS MY DOCTOR PRESCRIBING ZIO? ?The Zio system is proven and trusted by physicians to detect and diagnose irregular heart rhythms -- and has been prescribed to hundreds of thousands of patients. ? ?The FDA has cleared the Zio system to monitor for many different kinds of irregular heart rhythms. In a study, physicians were able to reach a diagnosis 90% of the time with the Zio system1. ? ?You can wear the Zio monitor -- a small, discreet, comfortable patch -- during your normal day-to-day activity, including while you sleep, shower, and exercise, while it records every single heartbeat for analysis. ? ?1Barrett, P., et al. Comparison of 24 Hour Holter Monitoring Versus 14 Day Novel Adhesive Patch Electrocardiographic Monitoring. American Journal of Medicine, 2014. ? ?ZIO VS. HOLTER MONITORING ?The Zio monitor can be comfortably worn for up to 14 days. Holter monitors can be worn for 24 to 48 hours, limiting the time to record any irregular heart rhythms you may have. Zio is able to capture data for the 51% of patients who have their first symptom-triggered arrhythmia after 48 hours.1 ? ?LIVE WITHOUT RESTRICTIONS ?The Zio ambulatory cardiac monitor is a small, unobtrusive, and water-resistant patch--you might even forget you?re wearing it. The Zio monitor records and stores every beat of your heart, whether you're sleeping, working out, or showering. ? ?Wear the monitor for 14 days, remove 01/05/22. ? ? ?Follow-Up: ?At [facility], you and your health  needs are our priority.  As part of our continuing mission to provide you with exceptional heart care, we have created designated Provider Care Teams.  These Care Teams include your primary Cardiologist (physician) and Advanced Practice Providers (APPs -  Physician Assistants and Nurse Practitioners)  who all work together to provide you with the care you need, when you need it. ? ?We recommend signing up for the patient portal called "MyChart".  Sign up information is provided on this After Visit Summary.  MyChart is used to connect with patients for Virtual Visits (Telemedicine).  Patients are able to view lab/test results, encounter notes, upcoming appointments, etc.  Non-urgent messages can be sent to your provider as well.   ?To learn more about what you can do with MyChart, go to [email or web address].   ? ?Your next appointment:   ?6 month(s) ? ?The format for your next appointment:   ?In Person ? ?Provider:   ?Belva Cromeajan Revankar, MD ? ? ?Other Instructions ?Cardiac Nuclear Scan ?A cardiac nuclear scan is a test that is done to check the flow of blood to your heart. It is done when you are resting and when you are exercising. The test looks for problems such as: ?Not enough blood reaching a portion of the heart. ?The heart muscle not working as it should. ?You may need this test if: ?You have heart disease. ?You have had lab results that are not normal. ?You have had heart surgery or a balloon procedure to open up blocked arteries (angioplasty). ?You have chest pain. ?You have shortness of breath. ?In this test, a special dye (tracer) is put into your bloodstream. The tracer will travel to your heart. A camera will then take pictures of your heart to see how the tracer moves through your heart. This test is usually done at a hospital and takes 2-4 hours. ?Tell a doctor about: ?Any allergies you have. ?All medicines you are taking, including vitamins, herbs, eye drops, creams, and over-the-counter medicines. ?Any problems you or family members have had with anesthetic medicines. ?Any blood disorders you have. ?Any surgeries you have had. ?Any medical conditions you have. ?Whether you are pregnant or may be pregnant. ?What are the risks? ?Generally, this is a safe test. However, problems may occur, such  as: ?Serious chest pain and heart attack. This is only a risk if the stress portion of the test is done. ?Rapid heartbeat. ?A feeling of warmth in your chest. This feeling usually does not last long. ?Allergic reaction to the tracer. ?What happens before the test? ?Ask your doctor about changing or stopping your normal medicines. This is important. ?Follow instructions from your doctor about what you cannot eat or drink. ?Remove your jewelry on the day of the test. ?What happens during the test? ?An IV tube will be inserted into one of your veins. ?Your doctor will give you a small amount of tracer through the IV tube. ?You will wait for 20-40 minutes while the tracer moves through your bloodstream. ?Your heart will be monitored with an electrocardiogram (ECG). ?You will lie down on an exam table. ?Pictures of your heart will be taken for about 15-20 minutes. ?You may also have a stress test. For this test, one of these things may be done: ?You will be asked to exercise on a treadmill or a stationary bike. ?You will be given medicines that will make your heart work harder. This is done if you are unable to exercise. ?When blood flow to your heart has  peaked, a tracer will again be given through the IV tube. ?After 20-40 minutes, you will get back on the exam table. More pictures will be taken of your heart. ?Depending on the tracer that is used, more pictures may need to be taken 3-4 hours later. ?Your IV tube will be removed when the test is over. ?The test may vary among doctors and hospitals. ?What happens after the test? ?Ask your doctor: ?Whether you can return to your normal schedule, including diet, activities, and medicines. ?Whether you should drink more fluids. This will help to remove the tracer from your body. Drink enough fluid to keep your pee (urine) pale yellow. ?Ask your doctor, or the department that is doing the test: ?When will my results be ready? ?How will I get my results? ?Summary ?A cardiac  nuclear scan is a test that is done to check the flow of blood to your heart. ?Tell your doctor whether you are pregnant or may be pregnant. ?Before the test, ask your doctor about changing or stopping your no

## 2021-12-23 LAB — HM HEPATITIS C SCREENING LAB: HM Hepatitis Screen: NEGATIVE

## 2021-12-29 ENCOUNTER — Encounter: Payer: Self-pay | Admitting: Legal Medicine

## 2022-01-02 ENCOUNTER — Telehealth: Payer: Self-pay | Admitting: *Deleted

## 2022-01-02 NOTE — Telephone Encounter (Signed)
Left message on voicemail per DPR in reference to upcoming appointment scheduled on 01/10/22 at 0815 with detailed instructions given per Myocardial Perfusion Study Information Sheet for the test. LM to arrive 15 minutes early, and that it is imperative to arrive on time for appointment to keep from having the test rescheduled. If you need to cancel or reschedule your appointment, please call the office within 24 hours of your appointment. Failure to do so may result in a cancellation of your appointment, and a $50 no show fee. Phone number given for call back for any questions. York Valliant, Adelene Idler ? ? ?

## 2022-01-10 ENCOUNTER — Ambulatory Visit (INDEPENDENT_AMBULATORY_CARE_PROVIDER_SITE_OTHER): Payer: Medicare Other

## 2022-01-10 ENCOUNTER — Other Ambulatory Visit: Payer: Medicare Other

## 2022-01-10 DIAGNOSIS — R0609 Other forms of dyspnea: Secondary | ICD-10-CM

## 2022-01-10 LAB — MYOCARDIAL PERFUSION IMAGING
LV dias vol: 114 mL (ref 46–106)
LV sys vol: 44 mL
Nuc Stress EF: 62 %
Peak HR: 93 {beats}/min
Rest HR: 57 {beats}/min
Rest Nuclear Isotope Dose: 10.7 mCi
SDS: 0
SRS: 3
SSS: 3
ST Depression (mm): 0 mm
Stress Nuclear Isotope Dose: 29.5 mCi
TID: 0.8

## 2022-01-10 LAB — ECHOCARDIOGRAM COMPLETE
Area-P 1/2: 2.09 cm2
Calc EF: 45.3 %
Height: 66 in
S' Lateral: 4.3 cm
Single Plane A2C EF: 50.6 %
Single Plane A4C EF: 41.9 %
Weight: 3584 oz

## 2022-01-10 MED ORDER — TECHNETIUM TC 99M TETROFOSMIN IV KIT
29.5000 | PACK | Freq: Once | INTRAVENOUS | Status: AC | PRN
  Administered 2022-01-10: 29.5 via INTRAVENOUS

## 2022-01-10 MED ORDER — REGADENOSON 0.4 MG/5ML IV SOLN
0.4000 mg | Freq: Once | INTRAVENOUS | Status: AC
  Administered 2022-01-10: 0.4 mg via INTRAVENOUS

## 2022-01-10 MED ORDER — PERFLUTREN LIPID MICROSPHERE
1.0000 mL | INTRAVENOUS | Status: AC | PRN
  Administered 2022-01-10: 5 mL via INTRAVENOUS

## 2022-01-10 MED ORDER — TECHNETIUM TC 99M TETROFOSMIN IV KIT
10.7000 | PACK | Freq: Once | INTRAVENOUS | Status: AC | PRN
  Administered 2022-01-10: 10.7 via INTRAVENOUS

## 2022-01-11 DIAGNOSIS — R002 Palpitations: Secondary | ICD-10-CM | POA: Diagnosis not present

## 2022-04-14 DIAGNOSIS — M62838 Other muscle spasm: Secondary | ICD-10-CM | POA: Diagnosis not present

## 2022-04-14 DIAGNOSIS — M542 Cervicalgia: Secondary | ICD-10-CM | POA: Diagnosis not present

## 2022-04-14 DIAGNOSIS — I10 Essential (primary) hypertension: Secondary | ICD-10-CM | POA: Diagnosis not present

## 2022-04-25 ENCOUNTER — Other Ambulatory Visit: Payer: Self-pay | Admitting: Legal Medicine

## 2022-06-19 NOTE — Progress Notes (Unsigned)
Subjective:  Patient ID: Victoria Barajas, female    DOB: September 02, 1946  Age: 76 y.o. MRN: [id number]  No chief complaint on file.   HPI Patient presents with hyperlipidemia.  Compliance with treatment has been good; patient takes medicines as directed, maintains low cholesterol diet, follows up as directed, and maintains exercise regimen.  Patient is using Rosuvastatin 20 mg daily without problems.    Patient presents for follow up of hypertension.  Patient tolerating Lisinopril 20 mg daily, Aspirin 81 mg daily well without side effects.  Patient is working on maintaining diet and exercise regimen and follows up as directed.   GERD: Omeprazole 40 mg daily. Current Outpatient Medications on File Prior to Visit  Medication Sig Dispense Refill   aspirin 81 MG EC tablet Take 81 mg by mouth daily. Swallow whole.     aspirin-acetaminophen-caffeine (EXCEDRIN MIGRAINE) 250-250-65 MG tablet Take 1 tablet by mouth every 6 (six) hours as needed for migraine.     lisinopril (ZESTRIL) 20 MG tablet TAKE 1 TABLET BY MOUTH EVERY DAY 90 tablet 2   naproxen sodium (ALEVE) 220 MG tablet Take 220 mg by mouth daily as needed for headache.     nitrofurantoin, macrocrystal-monohydrate, (MACROBID) 100 MG capsule Take 1 capsule (100 mg total) by mouth 2 (two) times daily. 14 capsule 0   nitroGLYCERIN (NITROSTAT) 0.4 MG SL tablet Place 0.4 mg under the tongue every 5 (five) minutes as needed for chest pain.     omeprazole (PRILOSEC) 40 MG capsule TAKE 1 CAPSULE BY MOUTH EVERY NIGHT AT BEDTIME 90 capsule 1   polyethylene glycol (MIRALAX / GLYCOLAX) packet Take 17 g by mouth daily as needed for diarrhea or loose stools.     rosuvastatin (CRESTOR) 20 MG tablet Take 1 tablet (20 mg total) by mouth daily. 90 tablet 3   No current facility-administered medications on file prior to visit.   Past Medical History:  Diagnosis Date   Age-related nuclear cataract of both eyes 01/06/2016   BMI 36.0-36.9,adult 03/11/2020    Essential hypertension 11/25/2017   Gastroesophageal reflux disease without esophagitis 01/06/2016   High cholesterol    High risk medication use 01/06/2016   LVH (left ventricular hypertrophy) 03/11/2020   Migraine headache 01/06/2016   Mixed hyperlipidemia 01/06/2016   Morbid obesity ([facility]) 09/09/2020   Myopia of both eyes 01/06/2016   Osteoporosis 01/06/2016   Palpitations 12/19/2021   Plantar fasciitis, left 01/06/2016   Presbyopia of both eyes 01/06/2016   Rectocele 05/23/2016   Rupture of flexor tendon of finger 10/28/2020   Spondylosis with myelopathy, lumbar region 11/12/2019   Urgency incontinence 06/13/2021   Past Surgical History:  Procedure Laterality Date   ABDOMINAL HYSTERECTOMY     TUBAL LIGATION     WRIST SURGERY      Family History  Problem Relation Age of Onset   Heart Problems Mother    Diabetes Father    Diabetes Sister    Kidney disease Sister    Diabetes Brother    Alzheimer's disease Brother    Heart attack Brother    Social History   Socioeconomic History   Marital status: Married    Spouse name: Not on file   Number of children: Not on file   Years of education: Not on file   Highest education level: Not on file  Occupational History   Not on file  Tobacco Use   Smoking status: Former    Types: Cigarettes    Quit date: 1993  Years since quitting: 30.7   Smokeless tobacco: Never  Substance and Sexual Activity   Alcohol use: No   Drug use: No   Sexual activity: Not Currently  Other Topics Concern   Not on file  Social History Narrative   Not on file   Social Determinants of Health   Financial Resource Strain: Not on file  Food Insecurity: No Food Insecurity (10/21/2020)   Hunger Vital Sign    Worried About Running Out of Food in the Last Year: Never true    Ran Out of Food in the Last Year: Never true  Transportation Needs: No Transportation Needs (10/21/2020)   PRAPARE - Administrator, Civil Service (Medical): No    Lack of  Transportation (Non-Medical): No  Physical Activity: Not on file  Stress: Not on file  Social Connections: Not on file    Review of Systems   Objective:  There were no vitals taken for this visit.     01/10/2022    8:19 AM 12/22/2021    3:15 PM 12/19/2021   10:09 AM  BP/Weight  Systolic BP  146 102  Diastolic BP  82 90  Wt. (Lbs) 224 224 224  BMI 36.15 kg/m2 36.15 kg/m2 36.15 kg/m2    Physical Exam  Diabetic Foot Exam - Simple   No data filed      Lab Results  Component Value Date   WBC 4.9 12/19/2021   HGB 14.1 12/19/2021   HCT 42.4 12/19/2021   PLT 226 12/19/2021   GLUCOSE 102 (H) 12/19/2021   CHOL 208 (H) 12/19/2021   TRIG 100 12/19/2021   HDL 50 12/19/2021   LDLCALC 140 (H) 12/19/2021   ALT 30 12/19/2021   AST 23 12/19/2021   NA 142 12/19/2021   K 4.7 12/19/2021   CL 105 12/19/2021   CREATININE 0.92 12/19/2021   BUN 19 12/19/2021   CO2 25 12/19/2021   TSH 2.090 09/09/2020   INR 0.95 11/08/2017      Assessment & Plan:   Problem List Items Addressed This Visit       Cardiovascular and Mediastinum   Essential hypertension - Primary     Digestive   Gastroesophageal reflux disease without esophagitis     Musculoskeletal and Integument   Osteoporosis     Other   Mixed hyperlipidemia   Urgency incontinence  .  No orders of the defined types were placed in this encounter.   No orders of the defined types were placed in this encounter.    Follow-up: No follow-ups on file.  An After Visit Summary was printed and given to the patient.  Brent Bulla, MD [facility] [phone]

## 2022-06-20 ENCOUNTER — Ambulatory Visit: Payer: Medicare Other | Admitting: Legal Medicine

## 2022-06-20 DIAGNOSIS — N3941 Urge incontinence: Secondary | ICD-10-CM

## 2022-06-20 DIAGNOSIS — E782 Mixed hyperlipidemia: Secondary | ICD-10-CM

## 2022-06-20 DIAGNOSIS — K219 Gastro-esophageal reflux disease without esophagitis: Secondary | ICD-10-CM

## 2022-06-20 DIAGNOSIS — I10 Essential (primary) hypertension: Secondary | ICD-10-CM

## 2022-06-20 DIAGNOSIS — M810 Age-related osteoporosis without current pathological fracture: Secondary | ICD-10-CM

## 2022-06-22 DIAGNOSIS — E7800 Pure hypercholesterolemia, unspecified: Secondary | ICD-10-CM | POA: Insufficient documentation

## 2022-06-25 ENCOUNTER — Ambulatory Visit: Payer: Medicare Other | Admitting: Cardiology

## 2022-06-25 ENCOUNTER — Encounter: Payer: Self-pay | Admitting: Nurse Practitioner

## 2022-06-25 ENCOUNTER — Ambulatory Visit (INDEPENDENT_AMBULATORY_CARE_PROVIDER_SITE_OTHER): Payer: Medicare Other | Admitting: Nurse Practitioner

## 2022-06-25 VITALS — BP 144/90 | HR 83 | Temp 97.0°F | Ht 66.0 in | Wt 223.0 lb

## 2022-06-25 DIAGNOSIS — J029 Acute pharyngitis, unspecified: Secondary | ICD-10-CM | POA: Diagnosis not present

## 2022-06-25 MED ORDER — AZITHROMYCIN 250 MG PO TABS: ORAL_TABLET | ORAL | 0 refills | Status: AC

## 2022-06-25 MED ORDER — LORATADINE 10 MG PO TABS: 10.0000 mg | ORAL_TABLET | Freq: Every day | ORAL | 11 refills | Status: DC

## 2022-06-25 MED ORDER — FLUTICASONE PROPIONATE 50 MCG/ACT NA SUSP: 2.0000 | Freq: Every day | NASAL | 6 refills | Status: DC

## 2022-06-25 NOTE — Progress Notes (Signed)
Acute Office Visit  Subjective:    Patient ID: Victoria Barajas, female    DOB: [birth year], 76 y.o.   MRN: [id number]  CC: Sore throat  HPI: Patient is in today for Upper respiratory symptoms She complains of congestion, nasal congestion, no  fever, non productive cough, post nasal drip, sore throat, and right ear pain .Denies fever, , chills, night sweats or rash. Onset of symptoms was a few days ago and improving.She is drinking plenty of fluids.  Past history is significant for no history of pneumonia or bronchitis. Patient is former smoker, quit 20 years ago. Gargled with salt water, taking tylenol and aleve.    Past Medical History:  Diagnosis Date   Age-related nuclear cataract of both eyes 01/06/2016   BMI 36.0-36.9,adult 03/11/2020   Dyspnea on exertion 12/22/2021   Essential hypertension 11/25/2017   Gastroesophageal reflux disease without esophagitis 01/06/2016   High cholesterol    LVH (left ventricular hypertrophy) 03/11/2020   Migraine headache 01/06/2016   Mixed hyperlipidemia 01/06/2016   Morbid obesity ([place]) 09/09/2020   Myopia of both eyes 01/06/2016   Osteoporosis 01/06/2016   Plantar fasciitis, left 01/06/2016   Presbyopia of both eyes 01/06/2016   Rectocele 05/23/2016   Rupture of flexor tendon of finger 10/28/2020   Spondylosis with myelopathy, lumbar region 11/12/2019   Urgency incontinence 06/13/2021    Past Surgical History:  Procedure Laterality Date   ABDOMINAL HYSTERECTOMY     TUBAL LIGATION     WRIST SURGERY      Family History  Problem Relation Age of Onset   Heart Problems Mother    Diabetes Father    Diabetes Sister    Kidney disease Sister    Diabetes Brother    Alzheimer's disease Brother    Heart attack Brother     Social History   Socioeconomic History   Marital status: Married    Spouse name: Not on file   Number of children: Not on file   Years of education: Not on file   Highest education level: Not on file   Occupational History   Not on file  Tobacco Use   Smoking status: Former    Types: Cigarettes    Quit date: 1993    Years since quitting: 30.7   Smokeless tobacco: Never  Substance and Sexual Activity   Alcohol use: No   Drug use: No   Sexual activity: Not Currently  Other Topics Concern   Not on file  Social History Narrative   Not on file   Social Determinants of Health   Financial Resource Strain: Not on file  Food Insecurity: No Food Insecurity (10/21/2020)   Hunger Vital Sign    Worried About Running Out of Food in the Last Year: Never true    Ran Out of Food in the Last Year: Never true  Transportation Needs: No Transportation Needs (10/21/2020)   PRAPARE - Hydrologist (Medical): No    Lack of Transportation (Non-Medical): No  Physical Activity: Not on file  Stress: Not on file  Social Connections: Not on file  Intimate Partner Violence: Not on file    Outpatient Medications Prior to Visit  Medication Sig Dispense Refill   aspirin 81 MG EC tablet Take 81 mg by mouth daily. Swallow whole.     aspirin-acetaminophen-caffeine (EXCEDRIN MIGRAINE) 250-250-65 MG tablet Take 1 tablet by mouth every 6 (six) hours as needed for migraine.     lisinopril (ZESTRIL) 20 MG tablet  TAKE 1 TABLET BY MOUTH EVERY DAY 90 tablet 2   naproxen sodium (ALEVE) 220 MG tablet Take 220 mg by mouth daily as needed for headache.     nitrofurantoin, macrocrystal-monohydrate, (MACROBID) 100 MG capsule Take 1 capsule (100 mg total) by mouth 2 (two) times daily. 14 capsule 0   nitroGLYCERIN (NITROSTAT) 0.4 MG SL tablet Place 0.4 mg under the tongue every 5 (five) minutes as needed for chest pain.     omeprazole (PRILOSEC) 40 MG capsule TAKE 1 CAPSULE BY MOUTH EVERY NIGHT AT BEDTIME 90 capsule 1   polyethylene glycol (MIRALAX / GLYCOLAX) packet Take 17 g by mouth daily as needed for diarrhea or loose stools.     rosuvastatin (CRESTOR) 20 MG tablet Take 1 tablet (20 mg  total) by mouth daily. 90 tablet 3   No facility-administered medications prior to visit.    Allergies  Allergen Reactions   Prolia [Denosumab] Other (See Comments)    Severe myalgia   Alendronate Other (See Comments)    Severe fatigue and aching   Tape Rash    SOME ADHESIVES LEAVE RASH, RED AREA    Review of Systems See pertinent positives and negatives per HPI.     Objective:   BP (!) 144/90   Pulse 83   Temp (!) 97 F (36.1 C)   Ht 5' 6"  (1.676 m)   Wt 223 lb (101.2 kg)   SpO2 96%   BMI 35.99 kg/m   Physical Exam Vitals reviewed.  Constitutional:      Appearance: Normal appearance.  HENT:     Right Ear: Tympanic membrane normal.     Left Ear: Tympanic membrane normal.     Nose: Congestion and rhinorrhea present.     Mouth/Throat:     Pharynx: Posterior oropharyngeal erythema present.  Eyes:     Pupils: Pupils are equal, round, and reactive to light.  Cardiovascular:     Rate and Rhythm: Normal rate and regular rhythm.     Pulses: Normal pulses.     Heart sounds: Normal heart sounds.  Pulmonary:     Effort: Pulmonary effort is normal.     Breath sounds: Normal breath sounds.  Skin:    General: Skin is warm and dry.     Capillary Refill: Capillary refill takes less than 2 seconds.  Neurological:     Mental Status: She is alert.    Wt Readings from Last 3 Encounters:  01/10/22 224 lb (101.6 kg)  12/22/21 224 lb (101.6 kg)  12/19/21 224 lb (101.6 kg)    Health Maintenance Due  Topic Date Due   COLONOSCOPY (Pts 45-53yr Insurance coverage will need to be confirmed)  Never done   DEXA SCAN  Never done   COVID-19 Vaccine (3 - Moderna series) 03/04/2020   TETANUS/TDAP  08/22/2020   MAMMOGRAM  09/14/2021   Zoster Vaccines- Shingrix (2 of 2) 02/13/2022   INFLUENZA VACCINE  05/08/2022       Lab Results  Component Value Date   TSH 2.090 09/09/2020   Lab Results  Component Value Date   WBC 4.9 12/19/2021   HGB 14.1 12/19/2021   HCT 42.4  12/19/2021   MCV 88 12/19/2021   PLT 226 12/19/2021   Lab Results  Component Value Date   NA 142 12/19/2021   K 4.7 12/19/2021   CO2 25 12/19/2021   GLUCOSE 102 (H) 12/19/2021   BUN 19 12/19/2021   CREATININE 0.92 12/19/2021   BILITOT 0.9 12/19/2021  ALKPHOS 79 12/19/2021   AST 23 12/19/2021   ALT 30 12/19/2021   PROT 6.5 12/19/2021   ALBUMIN 4.8 (H) 12/19/2021   CALCIUM 9.9 12/19/2021   ANIONGAP 14 11/08/2017   EGFR 65 12/19/2021   Lab Results  Component Value Date   CHOL 208 (H) 12/19/2021   Lab Results  Component Value Date   HDL 50 12/19/2021   Lab Results  Component Value Date   LDLCALC 140 (H) 12/19/2021   Lab Results  Component Value Date   TRIG 100 12/19/2021   Lab Results  Component Value Date   CHOLHDL 4.2 12/19/2021        Assessment & Plan:   1. Pharyngitis, unspecified etiology - azithromycin (ZITHROMAX) 250 MG tablet; Take 2 tablets on day 1, then 1 tablet daily on days 2 through 5  Dispense: 6 tablet; Refill: 0 - fluticasone (FLONASE) 50 MCG/ACT nasal spray; Place 2 sprays into both nostrils daily.  Dispense: 16 g; Refill: 6 - loratadine (CLARITIN) 10 MG tablet; Take 1 tablet (10 mg total) by mouth daily.  Dispense: 30 tablet; Refill: 11     Follow-up: PRN  An After Visit Summary was printed and given to the patient.  I, Rip Harbour, NP, have reviewed all documentation for this visit. The documentation on 06/25/22 for the exam, diagnosis, procedures, and orders are all accurate and complete.    Signed, Rip Harbour, NP [place] [phone]

## 2022-06-25 NOTE — Patient Instructions (Addendum)
Warm salt water gargles Replace toothbrush and toothpaste Rest and push fluids Take Z-pack as directed, with food Use Flonase nasal spray and Claritin 10 mg daily Follow-up as needed      Pharyngitis  Pharyngitis is a sore throat (pharynx). This is when there is redness, pain, and swelling in your throat. Most of the time, this condition gets better on its own. In some cases, you may need medicine. What are the causes? An infection from a virus. An infection from bacteria. Allergies. What increases the risk? Being 69-48 years old. Being in crowded environments. These include: Daycares. Schools. Dormitories. Living in a place with cold temperatures outside. Having a weakened disease-fighting (immune) system. What are the signs or symptoms? Symptoms may vary depending on the cause. Common symptoms include: Sore throat. Tiredness (fatigue). Low-grade fever. Stuffy nose. Cough. Headache. Other symptoms may include: Glands in the neck (lymph nodes) that are swollen. Skin rashes. Film on the throat or tonsils. This can be caused by an infection from bacteria. Vomiting. Red, itchy eyes. Loss of appetite. Joint pain and muscle aches. Tonsils that are temporarily bigger than usual (enlarged). How is this treated? Many times, treatment is not needed. This condition usually gets better in 3-4 days without treatment. If the infection is caused by a bacteria, you may be need to take antibiotics. Follow these instructions at home: Medicines Take over-the-counter and prescription medicines only as told by your doctor. If you were prescribed an antibiotic medicine, take it as told by your doctor. Do not stop taking the antibiotic even if you start to feel better. Use throat lozenges or sprays to soothe your throat as told by your doctor. Children can get pharyngitis. Do not give your child aspirin. Managing pain To help with pain, try: Sipping warm liquids, such  as: Broth. Herbal tea. Warm water. Eating or drinking cold or frozen liquids, such as frozen ice pops. Rinsing your mouth (gargle) with a salt water mixture 3-4 times a day or as needed. To make salt water, dissolve -1 tsp (3-6 g) of salt in 1 cup (237 mL) of warm water. Do not swallow this mixture. Sucking on hard candy or throat lozenges. Putting a cool-mist humidifier in your bedroom at night to moisten the air. Sitting in the bathroom with the door closed for 5-10 minutes while you run hot water in the shower.  General instructions  Do not smoke or use any products that contain nicotine or tobacco. If you need help quitting, ask your doctor. Rest as told by your doctor. Drink enough fluid to keep your pee (urine) pale yellow. How is this prevented? Wash your hands often for at least 20 seconds with soap and water. If soap and water are not available, use hand sanitizer. Do not touch your eyes, nose, or mouth with unwashed hands. Wash hands after touching these areas. Do not share cups or eating utensils. Avoid close contact with people who are sick. Contact a doctor if: You have large, tender lumps in your neck. You have a rash. You cough up green, yellow-brown, or bloody spit. Get help right away if: You have a stiff neck. You drool or cannot swallow liquids. You cannot drink or take medicines without vomiting. You have very bad pain that does not go away with medicine. You have problems breathing, and it is not from a stuffy nose. You have new pain and swelling in your knees, ankles, wrists, or elbows. These symptoms may be an emergency. Get help right away.  Call your local emergency services (911 in the [place] Do not wait to see if the symptoms will go away. Do not drive yourself to the hospital. Summary Pharyngitis is a sore throat (pharynx). This is when there is redness, pain, and swelling in your throat. Most of the time, pharyngitis gets better on its own.  Sometimes, you may need medicine. If you were prescribed an antibiotic medicine, take it as told by your doctor. Do not stop taking the antibiotic even if you start to feel better. This information is not intended to replace advice given to you by your health care provider. Make sure you discuss any questions you have with your health care provider. Document Revised: 12/21/2020 Document Reviewed: 12/21/2020 Elsevier Patient Education  [place].

## 2022-07-03 NOTE — Progress Notes (Unsigned)
Subjective:  Patient ID: Victoria Barajas, female    DOB: [birth year]  Age: 76 y.o. MRN: [id number]  No chief complaint on file.   HPI   Patient presents with hyperlipidemia.  Compliance with treatment has been good; patient takes medicines as directed, maintains low cholesterol diet, follows up as directed, and maintains exercise regimen.  Patient is using Rosuvastatin  20  mg daily without problems.   Migraine: Takes Excedrin OTC.  Hypertension: She takes Lisinopril 20 mg daily, Aspirin 81 mg daily. Current Outpatient Medications on File Prior to Visit  Medication Sig Dispense Refill   aspirin 81 MG EC tablet Take 81 mg by mouth daily. Swallow whole.     aspirin-acetaminophen-caffeine (EXCEDRIN MIGRAINE) 250-250-65 MG tablet Take 1 tablet by mouth every 6 (six) hours as needed for migraine.     fluticasone (FLONASE) 50 MCG/ACT nasal spray Place 2 sprays into both nostrils daily. 16 g 6   lisinopril (ZESTRIL) 20 MG tablet TAKE 1 TABLET BY MOUTH EVERY DAY 90 tablet 2   loratadine (CLARITIN) 10 MG tablet Take 1 tablet (10 mg total) by mouth daily. 30 tablet 11   naproxen sodium (ALEVE) 220 MG tablet Take 220 mg by mouth daily as needed for headache.     nitrofurantoin, macrocrystal-monohydrate, (MACROBID) 100 MG capsule Take 1 capsule (100 mg total) by mouth 2 (two) times daily. 14 capsule 0   nitroGLYCERIN (NITROSTAT) 0.4 MG SL tablet Place 0.4 mg under the tongue every 5 (five) minutes as needed for chest pain.     omeprazole (PRILOSEC) 40 MG capsule TAKE 1 CAPSULE BY MOUTH EVERY NIGHT AT BEDTIME 90 capsule 1   polyethylene glycol (MIRALAX / GLYCOLAX) packet Take 17 g by mouth daily as needed for diarrhea or loose stools.     rosuvastatin (CRESTOR) 20 MG tablet Take 1 tablet (20 mg total) by mouth daily. 90 tablet 3   No current facility-administered medications on file prior to visit.   Past Medical History:  Diagnosis Date   Age-related nuclear cataract of both eyes 01/06/2016   BMI  36.0-36.9,adult 03/11/2020   Dyspnea on exertion 12/22/2021   Essential hypertension 11/25/2017   Gastroesophageal reflux disease without esophagitis 01/06/2016   High cholesterol    LVH (left ventricular hypertrophy) 03/11/2020   Migraine headache 01/06/2016   Mixed hyperlipidemia 01/06/2016   Morbid obesity ([place]) 09/09/2020   Myopia of both eyes 01/06/2016   Osteoporosis 01/06/2016   Plantar fasciitis, left 01/06/2016   Presbyopia of both eyes 01/06/2016   Rectocele 05/23/2016   Rupture of flexor tendon of finger 10/28/2020   Spondylosis with myelopathy, lumbar region 11/12/2019   Urgency incontinence 06/13/2021   Past Surgical History:  Procedure Laterality Date   ABDOMINAL HYSTERECTOMY     TUBAL LIGATION     WRIST SURGERY      Family History  Problem Relation Age of Onset   Heart Problems Mother    Diabetes Father    Diabetes Sister    Kidney disease Sister    Diabetes Brother    Alzheimer's disease Brother    Heart attack Brother    Social History   Socioeconomic History   Marital status: Married    Spouse name: Not on file   Number of children: Not on file   Years of education: Not on file   Highest education level: Not on file  Occupational History   Not on file  Tobacco Use   Smoking status: Former    Types: Cigarettes  Quit date: 79    Years since quitting: 30.7   Smokeless tobacco: Never  Substance and Sexual Activity   Alcohol use: No   Drug use: No   Sexual activity: Not Currently  Other Topics Concern   Not on file  Social History Narrative   Not on file   Social Determinants of Health   Financial Resource Strain: Not on file  Food Insecurity: No Food Insecurity (10/21/2020)   Hunger Vital Sign    Worried About Running Out of Food in the Last Year: Never true    Ran Out of Food in the Last Year: Never true  Transportation Needs: No Transportation Needs (10/21/2020)   PRAPARE - Hydrologist (Medical): No     Lack of Transportation (Non-Medical): No  Physical Activity: Not on file  Stress: Not on file  Social Connections: Not on file    Review of Systems   Objective:  There were no vitals taken for this visit.     06/25/2022    3:04 PM 01/10/2022    8:19 AM 12/22/2021    3:15 PM  BP/Weight  Systolic BP 283  151  Diastolic BP 90  82  Wt. (Lbs) 223 224 224  BMI 35.99 kg/m2 36.15 kg/m2 36.15 kg/m2    Physical Exam  Diabetic Foot Exam - Simple   No data filed      Lab Results  Component Value Date   WBC 4.9 12/19/2021   HGB 14.1 12/19/2021   HCT 42.4 12/19/2021   PLT 226 12/19/2021   GLUCOSE 102 (H) 12/19/2021   CHOL 208 (H) 12/19/2021   TRIG 100 12/19/2021   HDL 50 12/19/2021   LDLCALC 140 (H) 12/19/2021   ALT 30 12/19/2021   AST 23 12/19/2021   NA 142 12/19/2021   K 4.7 12/19/2021   CL 105 12/19/2021   CREATININE 0.92 12/19/2021   BUN 19 12/19/2021   CO2 25 12/19/2021   TSH 2.090 09/09/2020   INR 0.95 11/08/2017      Assessment & Plan:   Problem List Items Addressed This Visit       Cardiovascular and Mediastinum   Essential hypertension - Primary     Digestive   Gastroesophageal reflux disease without esophagitis     Other   Mixed hyperlipidemia  .  No orders of the defined types were placed in this encounter.   No orders of the defined types were placed in this encounter.    Follow-up: No follow-ups on file.  An After Visit Summary was printed and given to the patient.  Reinaldo Meeker, MD [facility] [phone]

## 2022-07-04 ENCOUNTER — Encounter: Payer: Self-pay | Admitting: Legal Medicine

## 2022-07-04 ENCOUNTER — Ambulatory Visit (INDEPENDENT_AMBULATORY_CARE_PROVIDER_SITE_OTHER): Payer: Medicare Other | Admitting: Legal Medicine

## 2022-07-04 VITALS — BP 120/80 | HR 60 | Temp 97.0°F | Resp 15 | Ht 66.0 in | Wt 219.0 lb

## 2022-07-04 DIAGNOSIS — K219 Gastro-esophageal reflux disease without esophagitis: Secondary | ICD-10-CM

## 2022-07-04 DIAGNOSIS — G43C0 Periodic headache syndromes in child or adult, not intractable: Secondary | ICD-10-CM

## 2022-07-04 DIAGNOSIS — E782 Mixed hyperlipidemia: Secondary | ICD-10-CM | POA: Diagnosis not present

## 2022-07-04 DIAGNOSIS — N3000 Acute cystitis without hematuria: Secondary | ICD-10-CM | POA: Diagnosis not present

## 2022-07-04 DIAGNOSIS — I10 Essential (primary) hypertension: Secondary | ICD-10-CM | POA: Diagnosis not present

## 2022-07-04 DIAGNOSIS — Z6835 Body mass index (BMI) 35.0-35.9, adult: Secondary | ICD-10-CM | POA: Diagnosis not present

## 2022-07-04 LAB — POCT URINALYSIS DIP (CLINITEK)
Bilirubin, UA: NEGATIVE
Blood, UA: NEGATIVE
Glucose, UA: NEGATIVE mg/dL
Ketones, POC UA: NEGATIVE mg/dL
Nitrite, UA: NEGATIVE
POC PROTEIN,UA: NEGATIVE
Spec Grav, UA: 1.015 (ref 1.010–1.025)
Urobilinogen, UA: 0.2 E.U./dL
pH, UA: 6.5 (ref 5.0–8.0)

## 2022-07-04 NOTE — Progress Notes (Signed)
CULTURE URINE LP

## 2022-07-05 LAB — CBC WITH DIFFERENTIAL/PLATELET
Basophils Absolute: 0 10*3/uL (ref 0.0–0.2)
Basos: 1 %
EOS (ABSOLUTE): 0.1 10*3/uL (ref 0.0–0.4)
Eos: 2 %
Hematocrit: 40.9 % (ref 34.0–46.6)
Hemoglobin: 13.6 g/dL (ref 11.1–15.9)
Immature Grans (Abs): 0 10*3/uL (ref 0.0–0.1)
Immature Granulocytes: 0 %
Lymphocytes Absolute: 1 10*3/uL (ref 0.7–3.1)
Lymphs: 20 %
MCH: 29.8 pg (ref 26.6–33.0)
MCHC: 33.3 g/dL (ref 31.5–35.7)
MCV: 90 fL (ref 79–97)
Monocytes Absolute: 0.4 10*3/uL (ref 0.1–0.9)
Monocytes: 8 %
Neutrophils Absolute: 3.5 10*3/uL (ref 1.4–7.0)
Neutrophils: 69 %
Platelets: 257 10*3/uL (ref 150–450)
RBC: 4.57 x10E6/uL (ref 3.77–5.28)
RDW: 12.6 % (ref 11.7–15.4)
WBC: 5.1 10*3/uL (ref 3.4–10.8)

## 2022-07-05 LAB — COMPREHENSIVE METABOLIC PANEL
ALT: 24 IU/L (ref 0–32)
AST: 21 IU/L (ref 0–40)
Albumin/Globulin Ratio: 2.5 — ABNORMAL HIGH (ref 1.2–2.2)
Albumin: 4.8 g/dL (ref 3.8–4.8)
Alkaline Phosphatase: 70 IU/L (ref 44–121)
BUN/Creatinine Ratio: 16 (ref 12–28)
BUN: 15 mg/dL (ref 8–27)
Bilirubin Total: 1.2 mg/dL (ref 0.0–1.2)
CO2: 21 mmol/L (ref 20–29)
Calcium: 10.2 mg/dL (ref 8.7–10.3)
Chloride: 106 mmol/L (ref 96–106)
Creatinine, Ser: 0.92 mg/dL (ref 0.57–1.00)
Globulin, Total: 1.9 g/dL (ref 1.5–4.5)
Glucose: 107 mg/dL — ABNORMAL HIGH (ref 70–99)
Potassium: 4.6 mmol/L (ref 3.5–5.2)
Sodium: 143 mmol/L (ref 134–144)
Total Protein: 6.7 g/dL (ref 6.0–8.5)
eGFR: 65 mL/min/{1.73_m2} (ref >59)

## 2022-07-05 LAB — CARDIOVASCULAR RISK ASSESSMENT

## 2022-07-05 LAB — LIPID PANEL
Chol/HDL Ratio: 2.8 ratio (ref 0.0–4.4)
Cholesterol, Total: 141 mg/dL (ref 100–199)
HDL: 50 mg/dL (ref >39)
LDL Chol Calc (NIH): 69 mg/dL (ref 0–99)
Triglycerides: 125 mg/dL (ref 0–149)
VLDL Cholesterol Cal: 22 mg/dL (ref 5–40)

## 2022-07-06 NOTE — Progress Notes (Signed)
Glucose 107,kidney tests normal, liver tests normal, LDL cholesterol high is he taking his crestor?, CBC normal,  lp

## 2022-07-08 LAB — URINE CULTURE

## 2022-07-09 ENCOUNTER — Other Ambulatory Visit: Payer: Self-pay | Admitting: Legal Medicine

## 2022-07-09 DIAGNOSIS — N3941 Urge incontinence: Secondary | ICD-10-CM

## 2022-07-09 MED ORDER — NITROFURANTOIN MONOHYD MACRO 100 MG PO CAPS: 100.0000 mg | ORAL_CAPSULE | Freq: Two times a day (BID) | ORAL | 0 refills | Status: DC

## 2022-07-09 NOTE — Progress Notes (Signed)
Urine culture positive for klebsiella pneumonae, treat with macrobid.

## 2022-07-26 ENCOUNTER — Other Ambulatory Visit: Payer: Self-pay | Admitting: Legal Medicine

## 2022-08-24 ENCOUNTER — Other Ambulatory Visit: Payer: Self-pay | Admitting: Legal Medicine

## 2022-08-24 DIAGNOSIS — I10 Essential (primary) hypertension: Secondary | ICD-10-CM

## 2022-10-10 ENCOUNTER — Other Ambulatory Visit: Payer: Self-pay | Admitting: Legal Medicine

## 2022-10-10 ENCOUNTER — Other Ambulatory Visit: Payer: Self-pay | Admitting: Nurse Practitioner

## 2022-10-10 DIAGNOSIS — E782 Mixed hyperlipidemia: Secondary | ICD-10-CM

## 2022-10-10 MED ORDER — ROSUVASTATIN CALCIUM 20 MG PO TABS: 20.0000 mg | ORAL_TABLET | Freq: Every day | ORAL | 3 refills | Status: DC

## 2023-01-09 ENCOUNTER — Ambulatory Visit: Payer: Medicare Other | Admitting: Nurse Practitioner

## 2023-01-29 ENCOUNTER — Encounter: Payer: Self-pay | Admitting: Family Medicine

## 2023-01-29 ENCOUNTER — Ambulatory Visit (INDEPENDENT_AMBULATORY_CARE_PROVIDER_SITE_OTHER): Payer: Medicare Other | Admitting: Family Medicine

## 2023-01-29 VITALS — BP 132/84 | HR 65 | Temp 97.2°F | Ht 66.0 in | Wt 216.0 lb

## 2023-01-29 DIAGNOSIS — K219 Gastro-esophageal reflux disease without esophagitis: Secondary | ICD-10-CM | POA: Diagnosis not present

## 2023-01-29 DIAGNOSIS — E119 Type 2 diabetes mellitus without complications: Secondary | ICD-10-CM | POA: Insufficient documentation

## 2023-01-29 DIAGNOSIS — E559 Vitamin D deficiency, unspecified: Secondary | ICD-10-CM

## 2023-01-29 DIAGNOSIS — E6609 Other obesity due to excess calories: Secondary | ICD-10-CM

## 2023-01-29 DIAGNOSIS — R7301 Impaired fasting glucose: Secondary | ICD-10-CM

## 2023-01-29 DIAGNOSIS — N3941 Urge incontinence: Secondary | ICD-10-CM

## 2023-01-29 DIAGNOSIS — I10 Essential (primary) hypertension: Secondary | ICD-10-CM

## 2023-01-29 DIAGNOSIS — E782 Mixed hyperlipidemia: Secondary | ICD-10-CM | POA: Diagnosis not present

## 2023-01-29 DIAGNOSIS — Z6834 Body mass index (BMI) 34.0-34.9, adult: Secondary | ICD-10-CM

## 2023-01-29 DIAGNOSIS — Z1231 Encounter for screening mammogram for malignant neoplasm of breast: Secondary | ICD-10-CM | POA: Insufficient documentation

## 2023-01-29 DIAGNOSIS — M810 Age-related osteoporosis without current pathological fracture: Secondary | ICD-10-CM | POA: Insufficient documentation

## 2023-01-29 NOTE — Progress Notes (Unsigned)
Subjective:  Patient ID: Victoria Barajas, female    DOB: 1946/02/06  Age: 77 y.o. MRN: [id number]  Chief Complaint  Patient presents with   Hyperlipidemia   Gastroesophageal Reflux   Hypertension    HPI: High Cholesterol: Crestor 20 mg daily and aspirin 81 mg daily.   GERD: Prilosec 40 mg daily. Hiatal hernia  HTN: Lisinopril 20 mg daily  Impaired glucose: sugars have been up a little. Needs A1c.  Morbid obesity: eating healthy.   Allergic rhinitis: on flonase and claritin.        01/29/2023   10:51 AM 12/19/2021   10:15 AM 10/21/2020    9:43 AM 02/26/2020    9:51 AM  Depression screen PHQ 2/9  Decreased Interest 0 0 0 0  Down, Depressed, Hopeless 0 0 0 0  PHQ - 2 Score 0 0 0 0         09/09/2020    9:15 AM 06/13/2021    9:54 AM 07/04/2022    8:13 AM 07/04/2022    8:22 AM 01/29/2023   11:13 AM  Fall Risk  Falls in the past year? 0 0 0 0 0  Was there an injury with Fall? 0 0 0 0 0  Fall Risk Category Calculator 0 0 0 0 0  Fall Risk Category (Retired) Low Low Low Low   (RETIRED) Patient Fall Risk Level Low fall risk Low fall risk Low fall risk Low fall risk   Patient at Risk for Falls Due to  No Fall Risks Impaired balance/gait  No Fall Risks  Fall risk Follow up Falls evaluation completed  Education provided  Falls evaluation completed      Review of Systems  Constitutional:  Negative for chills, fatigue and fever.  HENT:  Negative for congestion, ear pain, rhinorrhea and sore throat.   Respiratory:  Negative for cough and shortness of breath.   Cardiovascular:  Negative for chest pain.  Gastrointestinal:  Negative for abdominal pain, constipation, diarrhea, nausea and vomiting.  Genitourinary:  Negative for dysuria and urgency.  Musculoskeletal:  Negative for back pain and myalgias.  Neurological:  Negative for dizziness, weakness, light-headedness and headaches.  Psychiatric/Behavioral:  Negative for dysphoric mood. The patient is not nervous/anxious.      Current Outpatient Medications on File Prior to Visit  Medication Sig Dispense Refill   aspirin 81 MG EC tablet Take 81 mg by mouth daily. Swallow whole.     aspirin-acetaminophen-caffeine (EXCEDRIN MIGRAINE) 250-250-65 MG tablet Take 1 tablet by mouth every 6 (six) hours as needed for migraine.     fluticasone (FLONASE) 50 MCG/ACT nasal spray Place 2 sprays into both nostrils daily. 16 g 6   lisinopril (ZESTRIL) 20 MG tablet TAKE 1 TABLET BY MOUTH EVERY DAY 90 tablet 2   loratadine (CLARITIN) 10 MG tablet Take 1 tablet (10 mg total) by mouth daily. 30 tablet 11   naproxen sodium (ALEVE) 220 MG tablet Take 220 mg by mouth daily as needed for headache.     nitroGLYCERIN (NITROSTAT) 0.4 MG SL tablet Place 0.4 mg under the tongue every 5 (five) minutes as needed for chest pain.     omeprazole (PRILOSEC) 40 MG capsule TAKE 1 CAPSULE BY MOUTH EVERY NIGHT AT BEDTIME 90 capsule 2   polyethylene glycol (MIRALAX / GLYCOLAX) packet Take 17 g by mouth daily as needed for diarrhea or loose stools.     rosuvastatin (CRESTOR) 20 MG tablet Take 1 tablet (20 mg total) by mouth daily. 90 tablet 3  No current facility-administered medications on file prior to visit.   Past Medical History:  Diagnosis Date   Age-related nuclear cataract of both eyes 01/06/2016   BMI 36.0-36.9,adult 03/11/2020   Dyspnea on exertion 12/22/2021   Essential hypertension 11/25/2017   Gastroesophageal reflux disease without esophagitis 01/06/2016   High cholesterol    History of shingles    LVH (left ventricular hypertrophy) 03/11/2020   Migraine headache 01/06/2016   Mixed hyperlipidemia 01/06/2016   Morbid obesity ([facility]) 09/09/2020   Myopia of both eyes 01/06/2016   Osteoporosis 01/06/2016   Plantar fasciitis, left 01/06/2016   Presbyopia of both eyes 01/06/2016   Rectocele 05/23/2016   Rupture of flexor tendon of finger 10/28/2020   Spondylosis with myelopathy, lumbar region 11/12/2019   Urgency incontinence  06/13/2021   Past Surgical History:  Procedure Laterality Date   ABDOMINAL HYSTERECTOMY     TUBAL LIGATION     WRIST SURGERY      Family History  Problem Relation Age of Onset   Heart Problems Mother    Diabetes Father    Diabetes Sister    Kidney disease Sister    Diabetes Brother    Alzheimer's disease Brother    Heart attack Brother    Social History   Socioeconomic History   Marital status: Married    Spouse name: Not on file   Number of children: Not on file   Years of education: Not on file   Highest education level: Not on file  Occupational History   Not on file  Tobacco Use   Smoking status: Former    Types: Cigarettes    Quit date: 1993    Years since quitting: 31.3   Smokeless tobacco: Never  Substance and Sexual Activity   Alcohol use: No   Drug use: No   Sexual activity: Not Currently  Other Topics Concern   Not on file  Social History Narrative   Not on file   Social Determinants of Health   Financial Resource Strain: Low Risk  (01/29/2023)   Overall Financial Resource Strain (CARDIA)    Difficulty of Paying Living Expenses: Not hard at all  Food Insecurity: No Food Insecurity (10/21/2020)   Hunger Vital Sign    Worried About Running Out of Food in the Last Year: Never true    Ran Out of Food in the Last Year: Never true  Transportation Needs: No Transportation Needs (10/21/2020)   PRAPARE - Administrator, Civil Service (Medical): No    Lack of Transportation (Non-Medical): No  Physical Activity: Inactive (01/29/2023)   Exercise Vital Sign    Days of Exercise per Week: 0 days    Minutes of Exercise per Session: 0 min  Stress: No Stress Concern Present (01/29/2023)   [employer] of Occupational Health - Occupational Stress Questionnaire    Feeling of Stress : Not at all  Social Connections: Moderately Isolated (01/29/2023)   Social Connection and Isolation Panel [NHANES]    Frequency of Communication with Friends and Family:  Three times a week    Frequency of Social Gatherings with Friends and Family: Three times a week    Attends Religious Services: Never    Active Member of Clubs or Organizations: No    Attends Banker Meetings: Never    Marital Status: Married    Objective:  BP 132/84   Pulse 65   Temp (!) 97.2 F (36.2 C)   Ht 5\' 6"  (1.676 m)   Wt  216 lb (98 kg)   SpO2 99%   BMI 34.86 kg/m      01/29/2023   11:00 AM 07/04/2022    8:09 AM 06/25/2022    3:04 PM  BP/Weight  Systolic BP 132 120 144  Diastolic BP 84 80 90  Wt. (Lbs) 216 219 223  BMI 34.86 kg/m2 35.35 kg/m2 35.99 kg/m2    Physical Exam Vitals reviewed.  Constitutional:      Appearance: Normal appearance. She is normal weight.  Neck:     Vascular: No carotid bruit.  Cardiovascular:     Rate and Rhythm: Normal rate and regular rhythm.     Heart sounds: Normal heart sounds.  Pulmonary:     Effort: Pulmonary effort is normal. No respiratory distress.     Breath sounds: Normal breath sounds.  Abdominal:     General: Abdomen is flat. Bowel sounds are normal.     Palpations: Abdomen is soft.     Tenderness: There is no abdominal tenderness.  Musculoskeletal:        General: Tenderness (rt lumbar) present.  Neurological:     Mental Status: She is alert and oriented to person, place, and time.  Psychiatric:        Mood and Affect: Mood normal.        Behavior: Behavior normal.     Diabetic Foot Exam - Simple   No data filed      Lab Results  Component Value Date   WBC 6.2 01/29/2023   HGB 14.4 01/29/2023   HCT 44.8 01/29/2023   PLT 246 01/29/2023   GLUCOSE 83 01/29/2023   CHOL 144 01/29/2023   TRIG 176 (H) 01/29/2023   HDL 44 01/29/2023   LDLCALC 70 01/29/2023   ALT 15 01/29/2023   AST 17 01/29/2023   NA 143 01/29/2023   K 4.9 01/29/2023   CL 104 01/29/2023   CREATININE 0.90 01/29/2023   BUN 13 01/29/2023   CO2 24 01/29/2023   TSH 2.090 09/09/2020   INR 0.95 11/08/2017   HGBA1C 6.0 (H)  01/29/2023      Assessment & Plan:    Impaired fasting glucose Assessment & Plan: Hemoglobin A1c 6.0, 3 month avg of blood sugars, is in prediabetic range.  In order to prevent progression to diabetes, recommend low carb diet and regular exercise   Orders: -     CBC with Differential/Platelet -     Hemoglobin A1c -     Cardiovascular Risk Assessment  Mixed hyperlipidemia Assessment & Plan: Well controlled.  No medication changes recommended. Continue crestor 20 mg daily and aspirin 81 mg daily.  Continue healthy diet and exercise.    Orders: -     Lipid panel  Essential hypertension Assessment & Plan: Well controlled.  No medication changes recommended. Continue healthy diet and exercise.   Labs drawn   Orders: -     Comprehensive metabolic panel  Gastroesophageal reflux disease without esophagitis Assessment & Plan: Well controlled.  No medication changes recommended. Continue healthy diet and exercise.     Class 1 obesity due to excess calories with serious comorbidity and body mass index (BMI) of 34.0 to 34.9 in adult Assessment & Plan: Recommend continue to work on eating healthy diet and exercise.    Age-related osteoporosis without current pathological fracture Assessment & Plan: Ordered Bone Density  Orders: -     DG Bone Density; Future  Screening mammogram for breast cancer -     Digital Screening Mammogram, Left  and Right; Future  Mild vitamin D deficiency Assessment & Plan: Check labs  Orders: -     VITAMIN D 25 Hydroxy (Vit-D Deficiency, Fractures)     No orders of the defined types were placed in this encounter.   Orders Placed This Encounter  Procedures   MM DIGITAL SCREENING BILATERAL   DG Bone Density   CBC with Differential/Platelet   Comprehensive metabolic panel   Lipid panel   Hemoglobin A1c   VITAMIN D 25 Hydroxy (Vit-D Deficiency, Fractures)   Cardiovascular Risk Assessment     Follow-up: Return in about 3 months  (around 04/30/2023) for chronic, fasting.   I,Katherina A Bramblett,acting as a scribe for Blane Ohara, MD.,have documented all relevant documentation on the behalf of Blane Ohara, MD,as directed by  Blane Ohara, MD while in the presence of Blane Ohara, MD.   Clayborn Bigness I Leal-Borjas,acting as a scribe for Blane Ohara, MD.,have documented all relevant documentation on the behalf of Blane Ohara, MD,as directed by  Blane Ohara, MD while in the presence of Blane Ohara, MD.    An After Visit Summary was printed and given to the patient.  I attest that I have reviewed this visit and agree with the plan scribed by my staff.   Blane Ohara, MD [facility] [phone]

## 2023-01-29 NOTE — Assessment & Plan Note (Addendum)
Well controlled.  No medication changes recommended. Continue crestor 20 mg daily and aspirin 81 mg daily.  Continue healthy diet and exercise.

## 2023-01-29 NOTE — Assessment & Plan Note (Signed)
Well controlled.  No medication changes recommended. Continue healthy diet and exercise.  Labs drawn 

## 2023-01-29 NOTE — Patient Instructions (Addendum)
Take one tylenol in the morning for back pain. Can also apply ice and heat for pain. Discuss with pharmacist about 2nd Shingrix vaccine. Consider cologuard

## 2023-01-29 NOTE — Assessment & Plan Note (Signed)
Well controlled.  No medication changes recommended. Continue healthy diet and exercise.  

## 2023-01-30 LAB — COMPREHENSIVE METABOLIC PANEL WITH GFR
ALT: 15 [IU]/L (ref 0–32)
AST: 17 [IU]/L (ref 0–40)
Albumin/Globulin Ratio: 2.5 — ABNORMAL HIGH (ref 1.2–2.2)
Albumin: 4.7 g/dL (ref 3.8–4.8)
Alkaline Phosphatase: 78 [IU]/L (ref 44–121)
BUN/Creatinine Ratio: 14 (ref 12–28)
BUN: 13 mg/dL (ref 8–27)
Bilirubin Total: 1.4 mg/dL — ABNORMAL HIGH (ref 0.0–1.2)
CO2: 24 mmol/L (ref 20–29)
Calcium: 10.1 mg/dL (ref 8.7–10.3)
Chloride: 104 mmol/L (ref 96–106)
Creatinine, Ser: 0.9 mg/dL (ref 0.57–1.00)
Globulin, Total: 1.9 g/dL (ref 1.5–4.5)
Glucose: 83 mg/dL (ref 70–99)
Potassium: 4.9 mmol/L (ref 3.5–5.2)
Sodium: 143 mmol/L (ref 134–144)
Total Protein: 6.6 g/dL (ref 6.0–8.5)
eGFR: 67 mL/min/{1.73_m2}

## 2023-01-30 LAB — CBC WITH DIFFERENTIAL/PLATELET
Basophils Absolute: 0.1 10*3/uL (ref 0.0–0.2)
Basos: 1 %
EOS (ABSOLUTE): 0.2 10*3/uL (ref 0.0–0.4)
Eos: 3 %
Hematocrit: 44.8 % (ref 34.0–46.6)
Hemoglobin: 14.4 g/dL (ref 11.1–15.9)
Immature Grans (Abs): 0 10*3/uL (ref 0.0–0.1)
Immature Granulocytes: 0 %
Lymphocytes Absolute: 1.1 10*3/uL (ref 0.7–3.1)
Lymphs: 18 %
MCH: 29.6 pg (ref 26.6–33.0)
MCHC: 32.1 g/dL (ref 31.5–35.7)
MCV: 92 fL (ref 79–97)
Monocytes Absolute: 0.6 10*3/uL (ref 0.1–0.9)
Monocytes: 9 %
Neutrophils Absolute: 4.3 10*3/uL (ref 1.4–7.0)
Neutrophils: 69 %
Platelets: 246 10*3/uL (ref 150–450)
RBC: 4.87 x10E6/uL (ref 3.77–5.28)
RDW: 12.3 % (ref 11.7–15.4)
WBC: 6.2 10*3/uL (ref 3.4–10.8)

## 2023-01-30 LAB — HEMOGLOBIN A1C
Est. average glucose Bld gHb Est-mCnc: 126 mg/dL
Hgb A1c MFr Bld: 6 % — ABNORMAL HIGH (ref 4.8–5.6)

## 2023-01-30 LAB — LIPID PANEL
Chol/HDL Ratio: 3.3 ratio (ref 0.0–4.4)
Cholesterol, Total: 144 mg/dL (ref 100–199)
HDL: 44 mg/dL (ref >39)
LDL Chol Calc (NIH): 70 mg/dL (ref 0–99)
Triglycerides: 176 mg/dL — ABNORMAL HIGH (ref 0–149)
VLDL Cholesterol Cal: 30 mg/dL (ref 5–40)

## 2023-01-30 LAB — CARDIOVASCULAR RISK ASSESSMENT

## 2023-01-30 LAB — VITAMIN D 25 HYDROXY (VIT D DEFICIENCY, FRACTURES): Vit D, 25-Hydroxy: 19.5 ng/mL — ABNORMAL LOW (ref 30.0–100.0)

## 2023-02-01 NOTE — Assessment & Plan Note (Signed)
Check labs 

## 2023-02-01 NOTE — Assessment & Plan Note (Signed)
Ordered Bone Density. ?

## 2023-02-01 NOTE — Assessment & Plan Note (Signed)
Hemoglobin A1c 6.0%, 3 month avg of blood sugars, is in prediabetic range.  In order to prevent progression to diabetes, recommend low carb diet and regular exercise 

## 2023-02-01 NOTE — Assessment & Plan Note (Signed)
>>  ASSESSMENT AND PLAN FOR IMPAIRED FASTING GLUCOSE WRITTEN ON 02/01/2023  3:44 PM BY LEAL-BORJAS, Ilham Roughton I, CMA  Hemoglobin A1c 6.0, 3 month avg of blood sugars, is in prediabetic range.  In order to prevent progression to diabetes, recommend low carb diet and regular exercise

## 2023-02-01 NOTE — Assessment & Plan Note (Signed)
Recommend continue to work on eating healthy diet and exercise.  

## 2023-02-09 ENCOUNTER — Ambulatory Visit: Payer: Medicare Other | Admitting: Nurse Practitioner

## 2023-02-14 DIAGNOSIS — M8589 Other specified disorders of bone density and structure, multiple sites: Secondary | ICD-10-CM | POA: Diagnosis not present

## 2023-02-14 DIAGNOSIS — Z1231 Encounter for screening mammogram for malignant neoplasm of breast: Secondary | ICD-10-CM | POA: Diagnosis not present

## 2023-02-14 LAB — HM DEXA SCAN

## 2023-02-14 LAB — HM MAMMOGRAPHY

## 2023-02-15 ENCOUNTER — Encounter: Payer: Self-pay | Admitting: Family Medicine

## 2023-02-19 ENCOUNTER — Encounter: Payer: Self-pay | Admitting: Family Medicine

## 2023-03-18 ENCOUNTER — Other Ambulatory Visit: Payer: Self-pay

## 2023-03-18 DIAGNOSIS — I10 Essential (primary) hypertension: Secondary | ICD-10-CM

## 2023-03-18 MED ORDER — LISINOPRIL 20 MG PO TABS
20.0000 mg | ORAL_TABLET | Freq: Every day | ORAL | 2 refills | Status: DC
Start: 2023-03-18 — End: 2023-12-30

## 2023-04-06 DIAGNOSIS — Z1612 Extended spectrum beta lactamase (ESBL) resistance: Secondary | ICD-10-CM | POA: Diagnosis not present

## 2023-04-06 DIAGNOSIS — R1011 Right upper quadrant pain: Secondary | ICD-10-CM | POA: Diagnosis not present

## 2023-04-06 DIAGNOSIS — K828 Other specified diseases of gallbladder: Secondary | ICD-10-CM | POA: Diagnosis not present

## 2023-04-06 DIAGNOSIS — G8929 Other chronic pain: Secondary | ICD-10-CM | POA: Diagnosis not present

## 2023-04-06 DIAGNOSIS — G43909 Migraine, unspecified, not intractable, without status migrainosus: Secondary | ICD-10-CM | POA: Diagnosis not present

## 2023-04-06 DIAGNOSIS — Z791 Long term (current) use of non-steroidal anti-inflammatories (NSAID): Secondary | ICD-10-CM | POA: Diagnosis not present

## 2023-04-06 DIAGNOSIS — Z792 Long term (current) use of antibiotics: Secondary | ICD-10-CM | POA: Diagnosis not present

## 2023-04-06 DIAGNOSIS — K819 Cholecystitis, unspecified: Secondary | ICD-10-CM | POA: Diagnosis not present

## 2023-04-06 DIAGNOSIS — Z87891 Personal history of nicotine dependence: Secondary | ICD-10-CM | POA: Diagnosis not present

## 2023-04-06 DIAGNOSIS — I10 Essential (primary) hypertension: Secondary | ICD-10-CM | POA: Diagnosis not present

## 2023-04-06 DIAGNOSIS — D135 Benign neoplasm of extrahepatic bile ducts: Secondary | ICD-10-CM | POA: Diagnosis not present

## 2023-04-06 DIAGNOSIS — K5730 Diverticulosis of large intestine without perforation or abscess without bleeding: Secondary | ICD-10-CM | POA: Diagnosis not present

## 2023-04-06 DIAGNOSIS — K760 Fatty (change of) liver, not elsewhere classified: Secondary | ICD-10-CM | POA: Diagnosis not present

## 2023-04-06 DIAGNOSIS — Z7982 Long term (current) use of aspirin: Secondary | ICD-10-CM | POA: Diagnosis not present

## 2023-04-06 DIAGNOSIS — R112 Nausea with vomiting, unspecified: Secondary | ICD-10-CM | POA: Diagnosis not present

## 2023-04-06 DIAGNOSIS — K810 Acute cholecystitis: Secondary | ICD-10-CM | POA: Diagnosis not present

## 2023-04-06 DIAGNOSIS — K7689 Other specified diseases of liver: Secondary | ICD-10-CM | POA: Diagnosis not present

## 2023-04-06 DIAGNOSIS — R109 Unspecified abdominal pain: Secondary | ICD-10-CM | POA: Diagnosis not present

## 2023-04-06 DIAGNOSIS — N390 Urinary tract infection, site not specified: Secondary | ICD-10-CM | POA: Diagnosis not present

## 2023-04-06 DIAGNOSIS — Z87440 Personal history of urinary (tract) infections: Secondary | ICD-10-CM | POA: Diagnosis not present

## 2023-04-06 DIAGNOSIS — F1090 Alcohol use, unspecified, uncomplicated: Secondary | ICD-10-CM | POA: Diagnosis not present

## 2023-04-06 DIAGNOSIS — M1990 Unspecified osteoarthritis, unspecified site: Secondary | ICD-10-CM | POA: Diagnosis not present

## 2023-04-06 DIAGNOSIS — K8080 Other cholelithiasis without obstruction: Secondary | ICD-10-CM | POA: Diagnosis not present

## 2023-04-06 DIAGNOSIS — Z0189 Encounter for other specified special examinations: Secondary | ICD-10-CM | POA: Diagnosis not present

## 2023-04-06 DIAGNOSIS — M549 Dorsalgia, unspecified: Secondary | ICD-10-CM | POA: Diagnosis not present

## 2023-04-06 DIAGNOSIS — Z79899 Other long term (current) drug therapy: Secondary | ICD-10-CM | POA: Diagnosis not present

## 2023-04-08 DIAGNOSIS — Z0189 Encounter for other specified special examinations: Secondary | ICD-10-CM | POA: Diagnosis not present

## 2023-04-08 HISTORY — PX: CHOLECYSTECTOMY: SHX55

## 2023-04-09 ENCOUNTER — Telehealth: Payer: Self-pay

## 2023-04-09 NOTE — Telephone Encounter (Signed)
Patient informed and she states that she is already scheduled to see the surgeon to follow up and is ok to follow up at the appointment for her regular follow up.

## 2023-04-09 NOTE — Telephone Encounter (Signed)
Patient called stating that she needs to schedule a hospital follow up with you. She states she was at [facility] and had her gallbladder removed, and I have printed the hospital notes they are on my desk. Please advise scheduling.

## 2023-04-14 DIAGNOSIS — K2980 Duodenitis without bleeding: Secondary | ICD-10-CM | POA: Diagnosis not present

## 2023-04-14 DIAGNOSIS — R0989 Other specified symptoms and signs involving the circulatory and respiratory systems: Secondary | ICD-10-CM | POA: Diagnosis not present

## 2023-04-14 DIAGNOSIS — R748 Abnormal levels of other serum enzymes: Secondary | ICD-10-CM | POA: Diagnosis not present

## 2023-04-14 DIAGNOSIS — R7989 Other specified abnormal findings of blood chemistry: Secondary | ICD-10-CM | POA: Diagnosis not present

## 2023-04-14 DIAGNOSIS — R918 Other nonspecific abnormal finding of lung field: Secondary | ICD-10-CM | POA: Diagnosis not present

## 2023-04-14 DIAGNOSIS — K859 Acute pancreatitis without necrosis or infection, unspecified: Secondary | ICD-10-CM

## 2023-04-14 DIAGNOSIS — R509 Fever, unspecified: Secondary | ICD-10-CM | POA: Diagnosis not present

## 2023-04-14 DIAGNOSIS — K5730 Diverticulosis of large intestine without perforation or abscess without bleeding: Secondary | ICD-10-CM | POA: Diagnosis not present

## 2023-04-14 HISTORY — DX: Acute pancreatitis without necrosis or infection, unspecified: K85.90

## 2023-04-15 DIAGNOSIS — Z66 Do not resuscitate: Secondary | ICD-10-CM | POA: Diagnosis not present

## 2023-04-15 DIAGNOSIS — Z9049 Acquired absence of other specified parts of digestive tract: Secondary | ICD-10-CM | POA: Diagnosis not present

## 2023-04-15 DIAGNOSIS — E782 Mixed hyperlipidemia: Secondary | ICD-10-CM | POA: Diagnosis not present

## 2023-04-15 DIAGNOSIS — I10 Essential (primary) hypertension: Secondary | ICD-10-CM | POA: Diagnosis not present

## 2023-04-15 DIAGNOSIS — Z87891 Personal history of nicotine dependence: Secondary | ICD-10-CM | POA: Diagnosis not present

## 2023-04-15 DIAGNOSIS — K219 Gastro-esophageal reflux disease without esophagitis: Secondary | ICD-10-CM | POA: Diagnosis not present

## 2023-04-15 DIAGNOSIS — Z86010 Personal history of colonic polyps: Secondary | ICD-10-CM | POA: Diagnosis not present

## 2023-04-15 DIAGNOSIS — R7989 Other specified abnormal findings of blood chemistry: Secondary | ICD-10-CM | POA: Diagnosis not present

## 2023-04-15 DIAGNOSIS — K859 Acute pancreatitis without necrosis or infection, unspecified: Secondary | ICD-10-CM | POA: Diagnosis not present

## 2023-04-15 DIAGNOSIS — Z7982 Long term (current) use of aspirin: Secondary | ICD-10-CM | POA: Diagnosis not present

## 2023-04-18 ENCOUNTER — Telehealth: Payer: Self-pay

## 2023-04-18 NOTE — Transitions of Care (Post Inpatient/ED Visit) (Signed)
   04/18/2023  Name: Victoria Barajas MRN: [id number] DOB: Jun 20, 1946  Today's TOC FU Call Status: Today's TOC FU Call Status:: Successful TOC FU Call Competed TOC FU Call Complete Date: 04/18/23  Transition Care Management Follow-up Telephone Call Date of Discharge: 04/17/23 Discharge Facility: Other ([facility]) Name of Other ([facility]) Discharge Facility: [facility] Type of Discharge: Inpatient Admission Primary Inpatient Discharge Diagnosis:: pancreatitis How have you been since you were released from the hospital?: Better Any questions or concerns?: No  Items Reviewed: Did you receive and understand the discharge instructions provided?: Yes Medications obtained,verified, and reconciled?: Yes (Medications Reviewed) Any new allergies since your discharge?: No Dietary orders reviewed?: Yes Do you have support at home?: Yes People in Home: spouse  Medications Reviewed Today: Medications Reviewed Today     Reviewed by Karena Addison, LPN (Licensed Practical Nurse) on 04/18/23 at [phone]  Med List Status: <None>   Medication Order Taking? Sig Documenting Provider Last Dose Status Informant  aspirin 81 MG EC tablet [id number] No Take 81 mg by mouth daily. Swallow whole. [provider] Taking Active   aspirin-acetaminophen-caffeine (EXCEDRIN MIGRAINE) [phone] MG tablet [id number] No Take 1 tablet by mouth every 6 (six) hours as needed for migraine. [provider] Taking Active   fluticasone (FLONASE) 50 MCG/ACT nasal spray [id number] No Place 2 sprays into both nostrils daily. Janie Morning, NP Taking Active   lisinopril (ZESTRIL) 20 MG tablet [id number]  Take 1 tablet (20 mg total) by mouth daily. Cox, Kirsten, MD  Active   loratadine (CLARITIN) 10 MG tablet [id number] No Take 1 tablet (10 mg total) by mouth daily. Janie Morning, NP Taking Active   naproxen sodium (ALEVE) 220 MG tablet [id number] No Take 220 mg by mouth daily as needed for headache. [provider] Taking Active   nitroGLYCERIN (NITROSTAT) 0.4 MG SL tablet [id number] No Place 0.4 mg under the tongue every 5 (five) minutes as needed for chest pain. [provider] Taking Active   omeprazole (PRILOSEC) 40 MG capsule [id number]  TAKE 1 CAPSULE BY MOUTH EVERY NIGHT AT BEDTIME Abigail Miyamoto, MD  Active   polyethylene glycol [facility] / GLYCOLAX) packet [id number] No Take 17 g by mouth daily as needed for diarrhea or loose stools. [provider] Taking Active   rosuvastatin (CRESTOR) 20 MG tablet [id number]  Take 1 tablet (20 mg total) by mouth daily. Lurline Del, FNP  Active             Home Care and Equipment/Supplies: Were Home Health Services Ordered?: NA Any new equipment or medical supplies ordered?: NA  Functional Questionnaire: Do you need assistance with bathing/showering or dressing?: No Do you need assistance with meal preparation?: No Do you need assistance with eating?: No Do you have difficulty maintaining continence: No Do you need assistance with getting out of bed/getting out of a chair/moving?: No Do you have difficulty managing or taking your medications?: No  Follow up appointments reviewed: PCP Follow-up appointment confirmed?: Yes Date of PCP follow-up appointment?: 04/24/23 Follow-up Provider: [facility] Follow-up appointment confirmed?: No Do you need transportation to your follow-up appointment?: No Do you understand care options if your condition(s) worsen?: Yes-patient verbalized understanding    SIGNATURE Karena Addison, LPN [facility] Nurse Health Advisor Direct Dial [phone]

## 2023-04-23 DIAGNOSIS — K810 Acute cholecystitis: Secondary | ICD-10-CM | POA: Diagnosis not present

## 2023-04-23 DIAGNOSIS — Z09 Encounter for follow-up examination after completed treatment for conditions other than malignant neoplasm: Secondary | ICD-10-CM | POA: Diagnosis not present

## 2023-04-24 ENCOUNTER — Ambulatory Visit (INDEPENDENT_AMBULATORY_CARE_PROVIDER_SITE_OTHER): Payer: Medicare Other | Admitting: Family Medicine

## 2023-04-24 VITALS — BP 138/82 | HR 111 | Temp 96.4°F | Ht 66.0 in | Wt 211.0 lb

## 2023-04-24 DIAGNOSIS — I110 Hypertensive heart disease with heart failure: Secondary | ICD-10-CM | POA: Diagnosis not present

## 2023-04-24 DIAGNOSIS — E782 Mixed hyperlipidemia: Secondary | ICD-10-CM | POA: Diagnosis not present

## 2023-04-24 DIAGNOSIS — K8500 Idiopathic acute pancreatitis without necrosis or infection: Secondary | ICD-10-CM | POA: Insufficient documentation

## 2023-04-24 DIAGNOSIS — I5020 Unspecified systolic (congestive) heart failure: Secondary | ICD-10-CM | POA: Insufficient documentation

## 2023-04-24 DIAGNOSIS — I10 Essential (primary) hypertension: Secondary | ICD-10-CM

## 2023-04-24 DIAGNOSIS — I5022 Chronic systolic (congestive) heart failure: Secondary | ICD-10-CM | POA: Diagnosis not present

## 2023-04-24 DIAGNOSIS — I499 Cardiac arrhythmia, unspecified: Secondary | ICD-10-CM | POA: Diagnosis not present

## 2023-04-24 DIAGNOSIS — R7301 Impaired fasting glucose: Secondary | ICD-10-CM | POA: Diagnosis not present

## 2023-04-24 HISTORY — DX: Idiopathic acute pancreatitis without necrosis or infection: K85.00

## 2023-04-24 NOTE — Assessment & Plan Note (Signed)
Continue Crestor.  Check lipid panel in 3 months. Return for annual wellness visit in August

## 2023-04-24 NOTE — Assessment & Plan Note (Signed)
Resolved

## 2023-04-24 NOTE — Assessment & Plan Note (Signed)
On lisinopril

## 2023-04-24 NOTE — Assessment & Plan Note (Signed)
A1C STABLE. Done at [facility].

## 2023-04-24 NOTE — Assessment & Plan Note (Signed)
EKG LBBB

## 2023-04-24 NOTE — Progress Notes (Signed)
Subjective:  Patient ID: Victoria Barajas, female    DOB: 05-Nov-1945  Age: 77 y.o. MRN: [id number]  Chief Complaint  Patient presents with   Hospitalization Follow-up    HPI  Patient is a 77 year old white female who presents for hospital follow-up after being admitted to [facility] from July 8 to April 17, 2023 for a main diagnosis of pancreatitis.  In addition patient has GERD, mixed hyperlipidemia, and hypertension.  Patient reported a few days prior to having her lap chole on July 1 she had nausea vomiting and diarrhea.  He was discharged following her surgery and was feeling well until July 7 when she developed severe epigastric pain radiating to her right lower ribs associated with nausea and fever.  Diarrhea had resolved.  Patient had reported she has had recurrent episodes of nausea vomiting diarrhea for approximately 50 years but had never been diagnosed with anything specifically.  Upon admission her urinalysis was normal, CBC was normal, liver function tests were elevated total bili 4.2, AST 214, ALT 248, lipase 17,893.  Patient did have 101 temperature upon admission.  Victoria Barajas  CT A/P with contrast showed:  -Acute edematous pancreatitis with no evidence of pancreatic necrosis or acute peripancreatic collection -Interval cholecystectomy, no fluid collection, there is intra and extrahepatic biliary ductal dilatation, no visualized choledocholithiasis - Mild hepatic steatosis - Mild sigmoid diverticulosis without diverticulitis   Patient was transferred to [facility] underwent MRCP which showed acute pancreatitis involving the pancreatic head and neck without evidence of necrosis or hemorrhage, biliary duct dilatation or choledocholithiasis.  ERCP was not indicated at the time.  LFTs and lipase significantly improved during her admission with GI rest and IV fluids.  Her abdominal pain resolved.  Her statin was held due to her elevated LFTs.  She was discharged home on her lisinopril and her  home PPI.  Around 2 PM today prior to coming to her appointment the patient had fluttering in her chest associate with some shortness of breath.  No chest pain.  She took some nitroglycerin and it did improve.  Patient says she has had these episodes for as long as she can remember.  Patient had a thorough workup with Dr. Tomie China this spring with a normal Holter monitor as well as a normal nuclear stress test.  Her echocardiogram did show mild mitral regurgitation as well systolic congestive heart failure.  Her EKG SHOWED LBBB. her echocardiogram showed 45-50%.          04/24/2023    3:56 PM 01/29/2023   10:51 AM 12/19/2021   10:15 AM 10/21/2020    9:43 AM 02/26/2020    9:51 AM  Depression screen PHQ 2/9  Decreased Interest 0 0 0 0 0  Down, Depressed, Hopeless 0 0 0 0 0  PHQ - 2 Score 0 0 0 0 0  Altered sleeping 1      Tired, decreased energy 3      Change in appetite 0      Feeling bad or failure about yourself  0      Trouble concentrating 0      Moving slowly or fidgety/restless 0      Suicidal thoughts 0      PHQ-9 Score 4      Difficult doing work/chores Not difficult at all            04/24/2023    3:56 PM  Fall Risk   Falls in the past year? 1  Number falls in past  yr: 0  Injury with Fall? 0  Risk for fall due to : No Fall Risks  Follow up Falls evaluation completed    Patient Care Team: Blane Ohara, MD as PCP - General (Family Medicine) Earvin Hansen, [facility] (Inactive) as Pharmacist (Pharmacist) Revankar, Aundra Dubin, MD as Consulting Physician (Cardiology)   Review of Systems  Constitutional:  Negative for chills, fatigue and fever.  HENT:  Negative for congestion, ear pain and sore throat.   Respiratory:  Positive for shortness of breath (About 1 hour ago). Negative for cough and chest tightness.   Cardiovascular:  Positive for palpitations. Negative for chest pain.  Gastrointestinal:  Negative for abdominal pain, constipation, diarrhea, nausea and vomiting.   Neurological:  Negative for dizziness.    Current Outpatient Medications on File Prior to Visit  Medication Sig Dispense Refill   aspirin 81 MG EC tablet Take 81 mg by mouth daily. Swallow whole.     aspirin-acetaminophen-caffeine (EXCEDRIN MIGRAINE) 250-250-65 MG tablet Take 1 tablet by mouth every 6 (six) hours as needed for migraine.     fluticasone (FLONASE) 50 MCG/ACT nasal spray Place 2 sprays into both nostrils daily. 16 g 6   lisinopril (ZESTRIL) 20 MG tablet Take 1 tablet (20 mg total) by mouth daily. 90 tablet 2   loratadine (CLARITIN) 10 MG tablet Take 1 tablet (10 mg total) by mouth daily. 30 tablet 11   naproxen sodium (ALEVE) 220 MG tablet Take 220 mg by mouth daily as needed for headache.     nitroGLYCERIN (NITROSTAT) 0.4 MG SL tablet Place 0.4 mg under the tongue every 5 (five) minutes as needed for chest pain.     omeprazole (PRILOSEC) 40 MG capsule TAKE 1 CAPSULE BY MOUTH EVERY NIGHT AT BEDTIME 90 capsule 2   polyethylene glycol (MIRALAX / GLYCOLAX) packet Take 17 g by mouth daily as needed for diarrhea or loose stools.     rosuvastatin (CRESTOR) 20 MG tablet Take 1 tablet (20 mg total) by mouth daily. 90 tablet 3   No current facility-administered medications on file prior to visit.   Past Medical History:  Diagnosis Date   Age-related nuclear cataract of both eyes 01/06/2016   BMI 36.0-36.9,adult 03/11/2020   Dyspnea on exertion 12/22/2021   Essential hypertension 11/25/2017   Gastroesophageal reflux disease without esophagitis 01/06/2016   High cholesterol    History of shingles    LVH (left ventricular hypertrophy) 03/11/2020   Migraine headache 01/06/2016   Mixed hyperlipidemia 01/06/2016   Morbid obesity (HCC) 09/09/2020   Myopia of both eyes 01/06/2016   Osteoporosis 01/06/2016   Plantar fasciitis, left 01/06/2016   Presbyopia of both eyes 01/06/2016   Rectocele 05/23/2016   Rupture of flexor tendon of finger 10/28/2020   Spondylosis with myelopathy,  lumbar region 11/12/2019   Urgency incontinence 06/13/2021   Past Surgical History:  Procedure Laterality Date   ABDOMINAL HYSTERECTOMY     TUBAL LIGATION     WRIST SURGERY      Family History  Problem Relation Age of Onset   Heart Problems Mother    Diabetes Father    Diabetes Sister    Kidney disease Sister    Diabetes Brother    Alzheimer's disease Brother    Heart attack Brother    Social History   Socioeconomic History   Marital status: Married    Spouse name: Not on file   Number of children: Not on file   Years of education: Not on file   Highest  education level: Not on file  Occupational History   Not on file  Tobacco Use   Smoking status: Former    Current packs/day: 0.00    Types: Cigarettes    Quit date: 66    Years since quitting: 31.5   Smokeless tobacco: Never  Substance and Sexual Activity   Alcohol use: No   Drug use: No   Sexual activity: Not Currently  Other Topics Concern   Not on file  Social History Narrative   Not on file   Social Determinants of Health   Financial Resource Strain: Low Risk  (01/29/2023)   Overall Financial Resource Strain (CARDIA)    Difficulty of Paying Living Expenses: Not hard at all  Food Insecurity: Low Risk  (04/15/2023)   Received from [facility]   Food vital sign    Within the past 12 months, you worried that your food would run out before you got money to buy more: Never true    Within the past 12 months, the food you bought just didn't last and you didn't have money to get more. : Never true  Transportation Needs: Not on file (04/15/2023)  Physical Activity: Inactive (01/29/2023)   Exercise Vital Sign    Days of Exercise per Week: 0 days    Minutes of Exercise per Session: 0 min  Stress: No Stress Concern Present (01/29/2023)   [employer] of Occupational Health - Occupational Stress Questionnaire    Feeling of Stress : Not at all  Social Connections: Moderately Isolated (01/29/2023)   Social  Connection and Isolation Panel [NHANES]    Frequency of Communication with Friends and Family: Three times a week    Frequency of Social Gatherings with Friends and Family: Three times a week    Attends Religious Services: Never    Active Member of Clubs or Organizations: No    Attends Engineer, structural: Never    Marital Status: Married    Objective:  BP 138/82 (BP Location: Left Arm, Patient Position: Sitting)   Pulse (!) 111   Temp (!) 96.4 F (35.8 C) (Temporal)   Ht 5\' 6"  (1.676 m)   Wt 211 lb (95.7 kg)   SpO2 98%   BMI 34.06 kg/m      04/24/2023    3:06 PM 01/29/2023   11:00 AM 07/04/2022    8:09 AM  BP/Weight  Systolic BP 138 132 120  Diastolic BP 82 84 80  Wt. (Lbs) 211 216 219  BMI 34.06 kg/m2 34.86 kg/m2 35.35 kg/m2    Physical Exam Vitals reviewed.  Constitutional:      Appearance: Normal appearance. She is normal weight.  Neck:     Vascular: No carotid bruit.  Cardiovascular:     Rate and Rhythm: Normal rate. Rhythm irregular.     Heart sounds: Normal heart sounds.     Comments: Sinus arrhythmia. Pulmonary:     Effort: Pulmonary effort is normal. No respiratory distress.     Breath sounds: Normal breath sounds.  Abdominal:     General: Abdomen is flat. Bowel sounds are normal.     Palpations: Abdomen is soft.     Tenderness: There is no abdominal tenderness.  Neurological:     Mental Status: She is alert and oriented to person, place, and time.  Psychiatric:        Mood and Affect: Mood normal.        Behavior: Behavior normal.     Diabetic Foot Exam - Simple  No data filed      Lab Results  Component Value Date   WBC 7.4 04/24/2023   HGB 13.1 04/24/2023   HCT 40.8 04/24/2023   PLT 428 04/24/2023   GLUCOSE 119 (H) 04/24/2023   CHOL 144 01/29/2023   TRIG 176 (H) 01/29/2023   HDL 44 01/29/2023   LDLCALC 70 01/29/2023   ALT 28 04/24/2023   AST 17 04/24/2023   NA 146 (H) 04/24/2023   K 5.1 04/24/2023   CL 105 04/24/2023    CREATININE 0.94 04/24/2023   BUN 14 04/24/2023   CO2 26 04/24/2023   TSH 1.940 04/24/2023   INR 0.95 11/08/2017   HGBA1C 6.0 (H) 01/29/2023      Assessment & Plan:    Cardiac arrhythmia, unspecified cardiac arrhythmia type Assessment & Plan: EKG: LBBB  Orders: -     EKG 12-Lead -     CBC with Differential/Platelet -     Comprehensive metabolic panel -     TSH  Idiopathic acute pancreatitis without infection or necrosis Assessment & Plan: Resolved.   Essential hypertension Assessment & Plan: Well controlled.  No changes to medicines.  Continue to work on eating a healthy diet and exercise.  Labs drawn today.      Impaired fasting glucose Assessment & Plan: A1C STABLE. Done at [facility].   Chronic systolic congestive heart failure [facility]) Assessment & Plan: On lisinopril.   Mixed hyperlipidemia Assessment & Plan: Continue Crestor.  Check lipid panel in 3 months. Return for annual wellness visit in August      No orders of the defined types were placed in this encounter.   Orders Placed This Encounter  Procedures   CBC with Differential/Platelet   Comprehensive metabolic panel   TSH   EKG 12-Lead     Follow-up: Return in about 4 weeks (around 05/22/2023) for chronic fasting.   I,Rylen Hou,acting as a Neurosurgeon for Blane Ohara, MD.,have documented all relevant documentation on the behalf of Blane Ohara, MD,as directed by  Blane Ohara, MD while in the presence of Blane Ohara, MD.   An After Visit Summary was printed and given to the patient.  Blane Ohara, MD [facility] [phone]

## 2023-04-24 NOTE — Assessment & Plan Note (Signed)
Well controlled.  ?No changes to medicines.  ?Continue to work on eating a healthy diet and exercise.  ?Labs drawn today.  ?

## 2023-04-24 NOTE — Assessment & Plan Note (Signed)
>>  ASSESSMENT AND PLAN FOR IMPAIRED FASTING GLUCOSE WRITTEN ON 04/24/2023  3:59 PM BY COX, KIRSTEN, MD  A1C STABLE. Done at [facility].

## 2023-04-25 LAB — CBC WITH DIFFERENTIAL/PLATELET
Basophils Absolute: 0 10*3/uL (ref 0.0–0.2)
Basos: 1 %
EOS (ABSOLUTE): 0.2 10*3/uL (ref 0.0–0.4)
Eos: 2 %
Hematocrit: 40.8 % (ref 34.0–46.6)
Hemoglobin: 13.1 g/dL (ref 11.1–15.9)
Immature Grans (Abs): 0 10*3/uL (ref 0.0–0.1)
Immature Granulocytes: 0 %
Lymphocytes Absolute: 1.1 10*3/uL (ref 0.7–3.1)
Lymphs: 15 %
MCH: 29 pg (ref 26.6–33.0)
MCHC: 32.1 g/dL (ref 31.5–35.7)
MCV: 91 fL (ref 79–97)
Monocytes Absolute: 0.6 10*3/uL (ref 0.1–0.9)
Monocytes: 8 %
Neutrophils Absolute: 5.4 10*3/uL (ref 1.4–7.0)
Neutrophils: 74 %
Platelets: 428 10*3/uL (ref 150–450)
RBC: 4.51 x10E6/uL (ref 3.77–5.28)
RDW: 12.3 % (ref 11.7–15.4)
WBC: 7.4 10*3/uL (ref 3.4–10.8)

## 2023-04-25 LAB — COMPREHENSIVE METABOLIC PANEL
ALT: 28 IU/L (ref 0–32)
AST: 17 IU/L (ref 0–40)
Albumin: 4.3 g/dL (ref 3.8–4.8)
Alkaline Phosphatase: 179 IU/L — ABNORMAL HIGH (ref 44–121)
BUN/Creatinine Ratio: 15 (ref 12–28)
BUN: 14 mg/dL (ref 8–27)
Bilirubin Total: 0.8 mg/dL (ref 0.0–1.2)
CO2: 26 mmol/L (ref 20–29)
Calcium: 10 mg/dL (ref 8.7–10.3)
Chloride: 105 mmol/L (ref 96–106)
Creatinine, Ser: 0.94 mg/dL (ref 0.57–1.00)
Globulin, Total: 2.1 g/dL (ref 1.5–4.5)
Glucose: 119 mg/dL — ABNORMAL HIGH (ref 70–99)
Potassium: 5.1 mmol/L (ref 3.5–5.2)
Sodium: 146 mmol/L — ABNORMAL HIGH (ref 134–144)
Total Protein: 6.4 g/dL (ref 6.0–8.5)
eGFR: 63 mL/min/{1.73_m2} (ref >59)

## 2023-04-25 LAB — TSH: TSH: 1.94 u[IU]/mL (ref 0.450–4.500)

## 2023-04-26 DIAGNOSIS — K851 Biliary acute pancreatitis without necrosis or infection: Secondary | ICD-10-CM | POA: Diagnosis not present

## 2023-04-26 DIAGNOSIS — Z86010 Personal history of colonic polyps: Secondary | ICD-10-CM | POA: Diagnosis not present

## 2023-04-26 DIAGNOSIS — R110 Nausea: Secondary | ICD-10-CM | POA: Diagnosis not present

## 2023-04-27 ENCOUNTER — Encounter: Payer: Self-pay | Admitting: Family Medicine

## 2023-05-03 LAB — LAB REPORT - SCANNED: EGFR: 57

## 2023-05-20 NOTE — Assessment & Plan Note (Signed)
Continue Crestor.  Check lipid panel in 3 months. Return for annual wellness visit in August

## 2023-05-20 NOTE — Assessment & Plan Note (Signed)
Well controlled.  No medication changes recommended. Continue healthy diet and exercise.  

## 2023-05-20 NOTE — Assessment & Plan Note (Addendum)
Well controlled. No changes to medicines.  Continue to work on eating a healthy diet and exercise.    

## 2023-05-20 NOTE — Assessment & Plan Note (Signed)
A1C STABLE.

## 2023-05-20 NOTE — Progress Notes (Signed)
Subjective:  Patient ID: Victoria Barajas, female    DOB: 10-Oct-1945  Age: 77 y.o. MRN: [id number]  Chief Complaint  Patient presents with   Medical Management of Chronic Issues    HPI   High Cholesterol: Crestor 20 mg daily and aspirin 81 mg daily.    GERD: Prilosec 40 mg daily. Hiatal hernia   HTN: Lisinopril 20 mg daily   Impaired glucose: A1C 6.0   Morbid obesity: eating healthy.    Allergic rhinitis: on flonase and claritin.    Has appt with GI this week.     04/24/2023    3:56 PM 01/29/2023   10:51 AM 12/19/2021   10:15 AM 10/21/2020    9:43 AM 02/26/2020    9:51 AM  Depression screen PHQ 2/9  Decreased Interest 0 0 0 0 0  Down, Depressed, Hopeless 0 0 0 0 0  PHQ - 2 Score 0 0 0 0 0  Altered sleeping 1      Tired, decreased energy 3      Change in appetite 0      Feeling bad or failure about yourself  0      Trouble concentrating 0      Moving slowly or fidgety/restless 0      Suicidal thoughts 0      PHQ-9 Score 4      Difficult doing work/chores Not difficult at all            04/24/2023    3:56 PM  Fall Risk   Falls in the past year? 1  Number falls in past yr: 0  Injury with Fall? 0  Risk for fall due to : No Fall Risks  Follow up Falls evaluation completed    Patient Care Team: Blane Ohara, MD as PCP - General (Family Medicine) Earvin Hansen, [facility] (Inactive) as Pharmacist (Pharmacist) Revankar, Aundra Dubin, MD as Consulting Physician (Cardiology)   Review of Systems  Constitutional:  Negative for chills, fatigue and fever.  HENT:  Negative for congestion, ear pain, rhinorrhea and sore throat.   Respiratory:  Negative for cough and shortness of breath.   Cardiovascular:  Negative for chest pain.  Gastrointestinal:  Negative for abdominal pain, constipation, diarrhea, nausea and vomiting.  Genitourinary:  Negative for dysuria and urgency.  Musculoskeletal:  Positive for back pain. Negative for myalgias.  Neurological:  Negative for dizziness,  weakness, light-headedness and headaches.  Psychiatric/Behavioral:  Negative for dysphoric mood. The patient is not nervous/anxious.     Current Outpatient Medications on File Prior to Visit  Medication Sig Dispense Refill   aspirin 81 MG EC tablet Take 81 mg by mouth daily. Swallow whole.     aspirin-acetaminophen-caffeine (EXCEDRIN MIGRAINE) 250-250-65 MG tablet Take 1 tablet by mouth every 6 (six) hours as needed for migraine.     fluticasone (FLONASE) 50 MCG/ACT nasal spray Place 2 sprays into both nostrils daily. 16 g 6   lisinopril (ZESTRIL) 20 MG tablet Take 1 tablet (20 mg total) by mouth daily. 90 tablet 2   loratadine (CLARITIN) 10 MG tablet Take 1 tablet (10 mg total) by mouth daily. 30 tablet 11   naproxen sodium (ALEVE) 220 MG tablet Take 220 mg by mouth daily as needed for headache.     nitroGLYCERIN (NITROSTAT) 0.4 MG SL tablet Place 0.4 mg under the tongue every 5 (five) minutes as needed for chest pain.     omeprazole (PRILOSEC) 40 MG capsule TAKE 1 CAPSULE BY MOUTH EVERY NIGHT AT  BEDTIME 90 capsule 2   polyethylene glycol (MIRALAX / GLYCOLAX) packet Take 17 g by mouth daily as needed for diarrhea or loose stools.     rosuvastatin (CRESTOR) 20 MG tablet Take 1 tablet (20 mg total) by mouth daily. 90 tablet 3   No current facility-administered medications on file prior to visit.   Past Medical History:  Diagnosis Date   Age-related nuclear cataract of both eyes 01/06/2016   BMI 36.0-36.9,adult 03/11/2020   Dyspnea on exertion 12/22/2021   Essential hypertension 11/25/2017   Gastroesophageal reflux disease without esophagitis 01/06/2016   High cholesterol    History of pancreatitis    History of shingles    LVH (left ventricular hypertrophy) 03/11/2020   Migraine headache 01/06/2016   Mixed hyperlipidemia 01/06/2016   Morbid obesity (HCC) 09/09/2020   Myopia of both eyes 01/06/2016   Osteoporosis 01/06/2016   Plantar fasciitis, left 01/06/2016   Presbyopia of both  eyes 01/06/2016   Rectocele 05/23/2016   Rupture of flexor tendon of finger 10/28/2020   Spondylosis with myelopathy, lumbar region 11/12/2019   Urgency incontinence 06/13/2021   Past Surgical History:  Procedure Laterality Date   ABDOMINAL HYSTERECTOMY     CHOLECYSTECTOMY  04/2023   TUBAL LIGATION     WRIST SURGERY      Family History  Problem Relation Age of Onset   Heart Problems Mother    Diabetes Father    Diabetes Sister    Kidney disease Sister    Diabetes Brother    Alzheimer's disease Brother    Heart attack Brother    Social History   Socioeconomic History   Marital status: Married    Spouse name: Not on file   Number of children: Not on file   Years of education: Not on file   Highest education level: Not on file  Occupational History   Not on file  Tobacco Use   Smoking status: Former    Current packs/day: 0.00    Types: Cigarettes    Quit date: 1993    Years since quitting: 31.6   Smokeless tobacco: Never  Substance and Sexual Activity   Alcohol use: No   Drug use: No   Sexual activity: Not Currently  Other Topics Concern   Not on file  Social History Narrative   Not on file   Social Determinants of Health   Financial Resource Strain: Low Risk  (01/29/2023)   Overall Financial Resource Strain (CARDIA)    Difficulty of Paying Living Expenses: Not hard at all  Food Insecurity: Low Risk  (04/15/2023)   Received from [facility]   Food vital sign    Within the past 12 months, you worried that your food would run out before you got money to buy more: Never true    Within the past 12 months, the food you bought just didn't last and you didn't have money to get more. : Never true  Transportation Needs: No Transportation Needs (05/21/2023)   PRAPARE - Administrator, Civil Service (Medical): No    Lack of Transportation (Non-Medical): No  Physical Activity: Inactive (05/21/2023)   Exercise Vital Sign    Days of Exercise per Week: 0 days     Minutes of Exercise per Session: 0 min  Stress: No Stress Concern Present (01/29/2023)   [employer] of Occupational Health - Occupational Stress Questionnaire    Feeling of Stress : Not at all  Social Connections: Moderately Isolated (01/29/2023)   Social Connection  and Isolation Panel [NHANES]    Frequency of Communication with Friends and Family: Three times a week    Frequency of Social Gatherings with Friends and Family: Three times a week    Attends Religious Services: Never    Active Member of Clubs or Organizations: No    Attends Engineer, structural: Never    Marital Status: Married    Objective:  BP 118/80   Pulse 84   Temp (!) 97.2 F (36.2 C)   Ht 5\' 6"  (1.676 m)   Wt 205 lb (93 kg)   SpO2 98%   BMI 33.09 kg/m      05/21/2023   10:02 AM 04/24/2023    3:06 PM 01/29/2023   11:00 AM  BP/Weight  Systolic BP 118 138 132  Diastolic BP 80 82 84  Wt. (Lbs) 205 211 216  BMI 33.09 kg/m2 34.06 kg/m2 34.86 kg/m2    Physical Exam Vitals reviewed.  Constitutional:      Appearance: Normal appearance. She is normal weight.  Neck:     Vascular: No carotid bruit.  Cardiovascular:     Rate and Rhythm: Normal rate and regular rhythm.     Heart sounds: Normal heart sounds.  Pulmonary:     Effort: Pulmonary effort is normal. No respiratory distress.     Breath sounds: Normal breath sounds.  Abdominal:     General: Abdomen is flat. Bowel sounds are normal.     Palpations: Abdomen is soft.     Tenderness: There is no abdominal tenderness.  Neurological:     Mental Status: She is alert and oriented to person, place, and time.  Psychiatric:        Mood and Affect: Mood normal.        Behavior: Behavior normal.     Diabetic Foot Exam - Simple   No data filed      Lab Results  Component Value Date   WBC 5.5 05/21/2023   HGB 13.8 05/21/2023   HCT 43.2 05/21/2023   PLT 230 05/21/2023   GLUCOSE 111 (H) 05/21/2023   CHOL 152 05/21/2023   TRIG 137  05/21/2023   HDL 48 05/21/2023   LDLCALC 80 05/21/2023   ALT 14 05/21/2023   AST 18 05/21/2023   NA 141 05/21/2023   K 5.1 05/21/2023   CL 104 05/21/2023   CREATININE 0.94 05/21/2023   BUN 18 05/21/2023   CO2 23 05/21/2023   TSH 1.940 04/24/2023   INR 0.95 11/08/2017   HGBA1C 6.6 (H) 05/21/2023      Assessment & Plan:    Essential hypertension Assessment & Plan: Well controlled.  No changes to medicines.  Continue to work on eating a healthy diet and exercise.    Orders: -     CBC with Differential/Platelet -     Comprehensive metabolic panel  Impaired fasting glucose Assessment & Plan: A1C STABLE.   Orders: -     Hemoglobin A1c  Mixed hyperlipidemia Assessment & Plan: Continue Crestor.  Check lipid panel in 3 months. Return for annual wellness visit in August  Orders: -     Lipid panel  Gastroesophageal reflux disease without esophagitis Assessment & Plan: Well controlled.  No medication changes recommended. Continue healthy diet and exercise.     Vitamin D insufficiency Assessment & Plan: Check labs  Orders: -     VITAMIN D 25 Hydroxy (Vit-D Deficiency, Fractures)  Class 1 obesity due to excess calories with serious comorbidity and body mass  index (BMI) of 33.0 to 33.9 in adult Assessment & Plan: Recommend continue to work on eating healthy diet and exercise.       No orders of the defined types were placed in this encounter.   Orders Placed This Encounter  Procedures   CBC with Differential/Platelet   Comprehensive metabolic panel   Hemoglobin A1c   Lipid panel   VITAMIN D 25 Hydroxy (Vit-D Deficiency, Fractures)     Follow-up: Return in about 6 months (around 11/21/2023) for chronic, chronic follow up.   I,Katherina A Bramblett,acting as a scribe for Blane Ohara, MD.,have documented all relevant documentation on the behalf of Blane Ohara, MD,as directed by  Blane Ohara, MD while in the presence of Blane Ohara, MD.   Clayborn Bigness I  Leal-Borjas,acting as a scribe for Blane Ohara, MD.,have documented all relevant documentation on the behalf of Blane Ohara, MD,as directed by  Blane Ohara, MD while in the presence of Blane Ohara, MD.    An After Visit Summary was printed and given to the patient.  I attest that I have reviewed this visit and agree with the plan scribed by my staff.  Blane Ohara, MD [facility] [phone]

## 2023-05-20 NOTE — Assessment & Plan Note (Signed)
>>  ASSESSMENT AND PLAN FOR IMPAIRED FASTING GLUCOSE WRITTEN ON 05/20/2023  4:30 PM BY BRAMBLETT, KATHERINA A, CMA  A1C STABLE.

## 2023-05-21 ENCOUNTER — Ambulatory Visit (INDEPENDENT_AMBULATORY_CARE_PROVIDER_SITE_OTHER): Payer: Medicare Other | Admitting: Family Medicine

## 2023-05-21 ENCOUNTER — Encounter: Payer: Self-pay | Admitting: Family Medicine

## 2023-05-21 VITALS — BP 118/80 | HR 84 | Temp 97.2°F | Ht 66.0 in | Wt 205.0 lb

## 2023-05-21 DIAGNOSIS — K219 Gastro-esophageal reflux disease without esophagitis: Secondary | ICD-10-CM

## 2023-05-21 DIAGNOSIS — Z6833 Body mass index (BMI) 33.0-33.9, adult: Secondary | ICD-10-CM

## 2023-05-21 DIAGNOSIS — I10 Essential (primary) hypertension: Secondary | ICD-10-CM

## 2023-05-21 DIAGNOSIS — E559 Vitamin D deficiency, unspecified: Secondary | ICD-10-CM | POA: Diagnosis not present

## 2023-05-21 DIAGNOSIS — R7301 Impaired fasting glucose: Secondary | ICD-10-CM | POA: Diagnosis not present

## 2023-05-21 DIAGNOSIS — E6609 Other obesity due to excess calories: Secondary | ICD-10-CM

## 2023-05-21 DIAGNOSIS — E782 Mixed hyperlipidemia: Secondary | ICD-10-CM | POA: Diagnosis not present

## 2023-05-21 NOTE — Patient Instructions (Signed)
Speak with Pharmacist about 2nd shingrix.

## 2023-05-24 DIAGNOSIS — K851 Biliary acute pancreatitis without necrosis or infection: Secondary | ICD-10-CM | POA: Diagnosis not present

## 2023-05-24 DIAGNOSIS — Z1211 Encounter for screening for malignant neoplasm of colon: Secondary | ICD-10-CM | POA: Diagnosis not present

## 2023-05-24 DIAGNOSIS — R110 Nausea: Secondary | ICD-10-CM | POA: Diagnosis not present

## 2023-05-26 NOTE — Assessment & Plan Note (Signed)
Check labs 

## 2023-05-26 NOTE — Assessment & Plan Note (Signed)
Recommend continue to work on eating healthy diet and exercise.  

## 2023-05-30 DIAGNOSIS — R110 Nausea: Secondary | ICD-10-CM | POA: Diagnosis not present

## 2023-05-30 DIAGNOSIS — Z1211 Encounter for screening for malignant neoplasm of colon: Secondary | ICD-10-CM | POA: Diagnosis not present

## 2023-05-30 DIAGNOSIS — K2270 Barrett's esophagus without dysplasia: Secondary | ICD-10-CM | POA: Diagnosis not present

## 2023-05-30 LAB — HM COLONOSCOPY

## 2023-06-04 ENCOUNTER — Ambulatory Visit: Payer: Medicare Other

## 2023-06-13 DIAGNOSIS — K635 Polyp of colon: Secondary | ICD-10-CM | POA: Diagnosis not present

## 2023-06-13 DIAGNOSIS — Z1211 Encounter for screening for malignant neoplasm of colon: Secondary | ICD-10-CM | POA: Diagnosis not present

## 2023-06-26 DIAGNOSIS — W19XXXA Unspecified fall, initial encounter: Secondary | ICD-10-CM | POA: Diagnosis not present

## 2023-06-26 DIAGNOSIS — R109 Unspecified abdominal pain: Secondary | ICD-10-CM | POA: Diagnosis not present

## 2023-06-26 DIAGNOSIS — M25551 Pain in right hip: Secondary | ICD-10-CM | POA: Diagnosis not present

## 2023-06-26 DIAGNOSIS — K5730 Diverticulosis of large intestine without perforation or abscess without bleeding: Secondary | ICD-10-CM | POA: Diagnosis not present

## 2023-07-29 ENCOUNTER — Telehealth: Payer: Self-pay

## 2023-07-29 NOTE — Telephone Encounter (Signed)
Transition Care Management Unsuccessful Follow-up Telephone Call  Date of discharge and from where:  06/26/2023 [facility] [place]  Attempts:  1st Attempt  Reason for unsuccessful TCM follow-up call:  No answer/busy  Halynn Reitano [facility]  [facility], [facility] Resource Care Guide Direct Dial: [phone]  Website: [email or web address]

## 2023-07-30 ENCOUNTER — Telehealth: Payer: Self-pay

## 2023-07-30 NOTE — Telephone Encounter (Signed)
Transition Care Management Follow-up Telephone Call Date of discharge and from where: 06/26/2023 [facility] How have you been since you were released from the hospital? Patient stated she is feeling much better. Patient stated that she was admitted to [facility] for pancreatitis from the ED for 4 days. Any questions or concerns? No  Items Reviewed: Did the pt receive and understand the discharge instructions provided? Yes  Medications obtained and verified? Yes  Other? No  Any new allergies since your discharge? No  Dietary orders reviewed? Yes Do you have support at home? Yes   Follow up appointments reviewed:  PCP Hospital f/u appt confirmed? No  Scheduled to see  on  @ . Specialist Hospital f/u appt confirmed? No  Scheduled to see  on  @ . Are transportation arrangements needed? No  If their condition worsens, is the pt aware to call PCP or go to the Emergency Dept.? Yes Was the patient provided with contact information for the PCP's office or ED? Yes Was to pt encouraged to call back with questions or concerns? Yes   Cam Dauphin [facility]  [facility], [facility] Guide Direct Dial: [phone]  Website: [email or web address]

## 2023-08-03 DIAGNOSIS — I10 Essential (primary) hypertension: Secondary | ICD-10-CM | POA: Diagnosis not present

## 2023-08-03 DIAGNOSIS — R001 Bradycardia, unspecified: Secondary | ICD-10-CM | POA: Diagnosis not present

## 2023-08-03 DIAGNOSIS — I509 Heart failure, unspecified: Secondary | ICD-10-CM | POA: Diagnosis not present

## 2023-08-03 DIAGNOSIS — I447 Left bundle-branch block, unspecified: Secondary | ICD-10-CM | POA: Diagnosis not present

## 2023-08-03 DIAGNOSIS — R9431 Abnormal electrocardiogram [ECG] [EKG]: Secondary | ICD-10-CM | POA: Diagnosis not present

## 2023-08-03 DIAGNOSIS — K219 Gastro-esophageal reflux disease without esophagitis: Secondary | ICD-10-CM | POA: Diagnosis not present

## 2023-08-03 DIAGNOSIS — R008 Other abnormalities of heart beat: Secondary | ICD-10-CM | POA: Diagnosis not present

## 2023-08-03 DIAGNOSIS — I700 Atherosclerosis of aorta: Secondary | ICD-10-CM | POA: Diagnosis not present

## 2023-08-03 DIAGNOSIS — I498 Other specified cardiac arrhythmias: Secondary | ICD-10-CM | POA: Diagnosis not present

## 2023-08-03 DIAGNOSIS — R531 Weakness: Secondary | ICD-10-CM | POA: Diagnosis not present

## 2023-08-07 ENCOUNTER — Encounter: Payer: Self-pay | Admitting: *Deleted

## 2023-08-07 ENCOUNTER — Ambulatory Visit: Payer: Medicare Other

## 2023-08-07 DIAGNOSIS — K648 Other hemorrhoids: Secondary | ICD-10-CM | POA: Diagnosis not present

## 2023-08-07 DIAGNOSIS — D126 Benign neoplasm of colon, unspecified: Secondary | ICD-10-CM | POA: Diagnosis not present

## 2023-08-07 DIAGNOSIS — K5790 Diverticulosis of intestine, part unspecified, without perforation or abscess without bleeding: Secondary | ICD-10-CM | POA: Diagnosis not present

## 2023-08-07 DIAGNOSIS — I499 Cardiac arrhythmia, unspecified: Secondary | ICD-10-CM | POA: Diagnosis not present

## 2023-08-07 NOTE — Progress Notes (Unsigned)
Cardiology Office Note:  .   Date:  08/08/2023  ID:  Victoria Barajas, DOB 02-Nov-1945, MRN [id number] PCP: Blane Ohara, MD  [facility] Providers Cardiologist:  Garwin Brothers, MD    History of Present Illness: .   Victoria Barajas is a 77 y.o. female with a past medical history of hypertension, GERD, dyslipidemia, LBBB, PVCs, aortic atherosclerosis.  04/14/2023 CT of the abdomen revealed aortic atherosclerosis 01/16/2022 monitor average heart rate 68 bpm, underlying rhythm was sinus, 32 episodes of SVT, SVE's were occasional at 1.2%, VE's were occasional at 2.7% 01/15/2022 MPI negative for ischemia 01/10/2022 echo EF 45 to 50%, mild LVH, g rade 1 DD, mild MR  Most recently evaluated by Dr. Tomie China on 12/22/2021 for DOE and palpitations.  An echocardiogram was completed on 01/10/2022 revealed an EF of 45 to 50%, mild LVH, grade 1 DD, mild MR.  She wore a monitor on 01/16/2022 which revealed an average heart rate of 68 bpm, underlying rhythm was sinus, 32 episodes of SVT occurred, SVE's were occasional at 1.2%, VE's were occasional at 2.7%.  She underwent an MPI on 01/15/2022 which was negative for ischemia.  She was evaluated in the emergency department on 08/03/2023 with concerns that her heart rate was fluctuating.  CMET CBC was stable.  EKG revealed normal sinus rhythm with PVCs.  She presents today accompanied by her husband for follow-up after recent ED visit as outlined above.  It appears her EKG revealed bigeminy in the emergency department she was advised to follow-up with cardiology.  CMET, TSH, magnesium without contributory causes.  She is in bigeminy in the office today, endorses shortness of breath and fatigue.  Discussed with her primary cardiologist, we ambulated her around the office, repeated EKG and her bigeminy frequency decreased. She denies chest pain, dyspnea, pnd, orthopnea, n, v, dizziness, syncope, edema, weight gain, or early satiety.    ROS: Review of Systems   Constitutional:  Positive for malaise/fatigue.  Respiratory:  Positive for shortness of breath.   Cardiovascular:  Positive for palpitations.  All other systems reviewed and are negative.   Studies Reviewed: Marland Kitchen   EKG Interpretation Date/Time:  Thursday August 08 2023 14:50:55 EDT Ventricular Rate:  71 PR Interval:  144 QRS Duration:  152 QT Interval:  442 QTC Calculation: 480 R Axis:   -27  Text Interpretation: Sinus rhythm with frequent Premature ventricular complexes Left bundle branch block Abnormal ECG When compared with ECG of 08-Aug-2023 14:07, No significant change was found Confirmed by Wallis Bamberg [phone]) on 08/08/2023 5:02:19 PM   Cardiac Studies & Procedures     STRESS TESTS  MYOCARDIAL PERFUSION IMAGING 01/10/2022  Interpretation Summary   Findings are consistent with no ischemia and no prior myocardial infarction. The study is low risk.   No ST deviation was noted.   Left ventricular function is normal. Nuclear stress EF: 62 %. The left ventricular ejection fraction is normal (55-65%). End diastolic cavity size is normal.   Prior study available for comparison from 08/26/2018.   ECHOCARDIOGRAM  ECHOCARDIOGRAM COMPLETE 01/10/2022  Narrative ECHOCARDIOGRAM REPORT    Patient Name:   DEMECIA Barajas Date of Exam: 01/10/2022 Medical Rec #:  [id number]     Height:       66.0 in Accession #:    [id number]    Weight:       224.0 lb Date of Birth:  1946/07/13    BSA:          2.098 m Patient Age:  75 years      BP:           146/82 mmHg Patient Gender: F             HR:           53 bpm. Exam Location:  [place]  Procedure: 2D Echo, Cardiac Doppler, Color Doppler and Strain Analysis  Indications:    Dyspnea on exertion [R06.09 (ICD-10-CM)]  History:        Patient has prior history of Echocardiogram examinations, most recent 08/25/2018. LVH, Arrythmias:Palpitations, Signs/Symptoms:Morbid obesity; Risk Factors:Hypertension and Dyslipidemia.  Sonographer:     Louie Boston RDCS Referring Phys: Rito Ehrlich [facility]  IMPRESSIONS   1. Left ventricular ejection fraction, by estimation, is 45 to 50%. The left ventricle has mildly decreased function. The left ventricle has no regional wall motion abnormalities. There is mild left ventricular hypertrophy. Left ventricular diastolic parameters are consistent with Grade I diastolic dysfunction (impaired relaxation). 2. Right ventricular systolic function is normal. The right ventricular size is normal. There is normal pulmonary artery systolic pressure. 3. The mitral valve is normal in structure. Mild mitral valve regurgitation. No evidence of mitral stenosis. 4. The aortic valve is normal in structure. Aortic valve regurgitation is not visualized. No aortic stenosis is present. 5. The inferior vena cava is normal in size with greater than 50% respiratory variability, suggesting right atrial pressure of 3 mmHg.  FINDINGS Left Ventricle: Left ventricular ejection fraction, by estimation, is 45 to 50%. The left ventricle has mildly decreased function. The left ventricle has no regional wall motion abnormalities. Definity contrast agent was given IV to delineate the left ventricular endocardial borders. The left ventricular internal cavity size was normal in size. There is mild left ventricular hypertrophy. Left ventricular diastolic parameters are consistent with Grade I diastolic dysfunction (impaired relaxation).  Right Ventricle: The right ventricular size is normal. No increase in right ventricular wall thickness. Right ventricular systolic function is normal. There is normal pulmonary artery systolic pressure. The tricuspid regurgitant velocity is 2.44 m/s, and with an assumed right atrial pressure of 3 mmHg, the estimated right ventricular systolic pressure is 26.8 mmHg.  Left Atrium: Left atrial size was normal in size.  Right Atrium: Right atrial size was normal in size.  Pericardium: There is no  evidence of pericardial effusion.  Mitral Valve: The mitral valve is normal in structure. Mild mitral valve regurgitation. No evidence of mitral valve stenosis.  Tricuspid Valve: The tricuspid valve is normal in structure. Tricuspid valve regurgitation is mild . No evidence of tricuspid stenosis.  Aortic Valve: The aortic valve is normal in structure. Aortic valve regurgitation is not visualized. No aortic stenosis is present.  Pulmonic Valve: The pulmonic valve was normal in structure. Pulmonic valve regurgitation is not visualized. No evidence of pulmonic stenosis.  Aorta: The aortic root is normal in size and structure.  Venous: The inferior vena cava is normal in size with greater than 50% respiratory variability, suggesting right atrial pressure of 3 mmHg.  IAS/Shunts: No atrial level shunt detected by color flow Doppler.   LEFT VENTRICLE PLAX 2D LVIDd:         5.80 cm     Diastology LVIDs:         4.30 cm     LV e' medial:    3.19 cm/s LV PW:         1.20 cm     LV E/e' medial:  22.1 LV IVS:  1.20 cm     LV e' lateral:   5.12 cm/s LVOT diam:     2.00 cm     LV E/e' lateral: 13.8 LV SV:         61 LV SV Index:   29 LVOT Area:     3.14 cm  LV Volumes (MOD) LV vol d, MOD A2C: 85.9 ml LV vol d, MOD A4C: 88.7 ml LV vol s, MOD A2C: 42.4 ml LV vol s, MOD A4C: 51.6 ml LV SV MOD A2C:     43.5 ml LV SV MOD A4C:     88.7 ml LV SV MOD BP:      39.9 ml  RIGHT VENTRICLE            IVC RV S prime:     8.58 cm/s  IVC diam: 1.60 cm TAPSE (M-mode): 3.3 cm  LEFT ATRIUM             Index        RIGHT ATRIUM           Index LA diam:        4.10 cm 1.95 cm/m   RA Area:     12.60 cm LA Vol (A2C):   63.4 ml 30.21 ml/m  RA Volume:   27.10 ml  12.91 ml/m LA Vol (A4C):   46.1 ml 21.97 ml/m LA Biplane Vol: 56.3 ml 26.83 ml/m AORTIC VALVE LVOT Vmax:   91.70 cm/s LVOT Vmean:  65.900 cm/s LVOT VTI:    0.195 m  AORTA Ao Root diam: 3.00 cm Ao Asc diam:  3.70 cm Ao Desc diam:  2.10 cm  MITRAL VALVE               TRICUSPID VALVE MV Area (PHT): 2.09 cm    TR Peak grad:   23.8 mmHg MV Decel Time: 363 msec    TR Vmax:        244.00 cm/s MV E velocity: 70.60 cm/s MV A velocity: 94.60 cm/s  SHUNTS MV E/A ratio:  0.75        Systemic VTI:  0.20 m Systemic Diam: 2.00 cm  Gypsy Balsam MD Electronically signed by Gypsy Balsam MD Signature Date/Time: 01/10/2022/3:10:32 PM    Final    MONITORS  LONG TERM MONITOR (3-14 DAYS) 01/16/2022  Narrative Patch Wear Time:  13 days and 18 hours (2023-03-17T16:06:00-0400 to 2023-03-31T10:21:11-398)  Patient had a min HR of 39 bpm, max HR of 190 bpm, and avg HR of 68 bpm.  Predominant underlying rhythm was Sinus Rhythm. Bundle Branch Block/IVCD was present.  32 Supraventricular Tachycardia runs occurred, the run with the fastest interval lasting 10 beats with a max rate of 190 bpm, the longest lasting 14 beats with an avg rate of 113 bpm. Isolated SVEs were occasional (1.2%, [id number]), SVE Couplets were rare (<1.0%, 370), and SVE Triplets were rare (<1.0%, 41).  Isolated VEs were occasional (2.7%, 34961), VE Couplets were rare (<1.0%, 2041), and no VE Triplets were present. Ventricular Bigeminy and Trigeminy were present.  Impression: Mildly abnormal but otherwise unremarkable event monitor.  Rare brief atrial runs noted.           Risk Assessment/Calculations:             Physical Exam:   VS:  BP 130/80 Comment: home reading  Pulse 81   Ht 5\' 6"  (1.676 m)   Wt 208 lb (94.3 kg)   SpO2 99%   BMI 33.57 kg/m  Wt Readings from Last 3 Encounters:  08/08/23 208 lb (94.3 kg)  05/21/23 205 lb (93 kg)  04/24/23 211 lb (95.7 kg)    GEN: Well nourished, well developed in no acute distress NECK: No JVD; No carotid bruits CARDIAC: RRR, no murmurs, rubs, gallops RESPIRATORY:  Clear to auscultation without rales, wheezing or rhonchi  ABDOMEN: Soft, non-tender, non-distended EXTREMITIES:  No edema; No deformity    ASSESSMENT AND PLAN: .   Palpitations/bigeminy/frequent PVCs-CMET without contributory causes, TSH and magnesium were WNL.  She had an MPI last year that was negative for ischemia.  We ambulated her around the office a few times, repeated EKG, and her PVC burden decreased.  We will start her on Toprol 25 mg daily.  Will repeat a 2-week monitor for PVC burden.  Hypertension-her blood pressure is elevated in the office today however she is is not feeling well and this is been quite stressful for her.  She states at home is typically well-controlled in the 100-130 range systolic, typically the highest her diastolic is is in the 80s.  Will not make any blood pressure medication changes at this time.  Continue lisinopril 20 mg daily--if her blood pressure continues to be elevated we could increase her lisinopril to 40.  Aortic atherosclerosis-this was noted on CT imaging, Stable with no anginal symptoms. No indication for ischemic evaluation.  Aspirin 81 mg daily, continue nitroglycerin as needed.  Dyslipidemia-most recent LDL was controlled at 80, continue Crestor 20 mg daily.       Dispo: Monitor for 2 weeks, metoprolol 25 mg daily.  Signed, Flossie Dibble, NP

## 2023-08-08 ENCOUNTER — Ambulatory Visit (INDEPENDENT_AMBULATORY_CARE_PROVIDER_SITE_OTHER): Payer: Medicare Other | Admitting: *Deleted

## 2023-08-08 ENCOUNTER — Encounter: Payer: Self-pay | Admitting: Cardiology

## 2023-08-08 ENCOUNTER — Ambulatory Visit: Payer: Medicare Other | Attending: Cardiology | Admitting: Cardiology

## 2023-08-08 ENCOUNTER — Ambulatory Visit: Payer: Medicare Other | Attending: Cardiology

## 2023-08-08 VITALS — BP 130/80 | HR 81 | Ht 66.0 in | Wt 208.0 lb

## 2023-08-08 DIAGNOSIS — E782 Mixed hyperlipidemia: Secondary | ICD-10-CM | POA: Diagnosis not present

## 2023-08-08 DIAGNOSIS — R002 Palpitations: Secondary | ICD-10-CM

## 2023-08-08 DIAGNOSIS — I700 Atherosclerosis of aorta: Secondary | ICD-10-CM

## 2023-08-08 DIAGNOSIS — I10 Essential (primary) hypertension: Secondary | ICD-10-CM

## 2023-08-08 DIAGNOSIS — I498 Other specified cardiac arrhythmias: Secondary | ICD-10-CM | POA: Diagnosis not present

## 2023-08-08 DIAGNOSIS — Z0000 Encounter for general adult medical examination without abnormal findings: Secondary | ICD-10-CM | POA: Diagnosis not present

## 2023-08-08 MED ORDER — METOPROLOL SUCCINATE ER 25 MG PO TB24: 25.0000 mg | ORAL_TABLET | Freq: Every day | ORAL | 3 refills | Status: DC

## 2023-08-08 NOTE — Patient Instructions (Signed)
Medication Instructions:  Your physician has recommended you make the following change in your medication:  Start Toprol 25 mg once daily  *If you need a refill on your cardiac medications before your next appointment, please call your pharmacy*   Lab Work: NONE If you have labs (blood work) drawn today and your tests are completely normal, you will receive your results only by: MyChart Message (if you have MyChart) OR A paper copy in the mail If you have any lab test that is abnormal or we need to change your treatment, we will call you to review the results.   Testing/Procedures: You have been asked to wear a Zio Heart Monitor today. It is to be worn for 14 days. Please remove the monitor on 11/14 and mail back in the box provided.  If you have any questions about the monitor please call the company at [phone]     Follow-Up: At [facility], you and your health needs are our priority.  As part of our continuing mission to provide you with exceptional heart care, we have created designated Provider Care Teams.  These Care Teams include your primary Cardiologist (physician) and Advanced Practice Providers (APPs -  Physician Assistants and Nurse Practitioners) who all work together to provide you with the care you need, when you need it.  We recommend signing up for the patient portal called "MyChart".  Sign up information is provided on this After Visit Summary.  MyChart is used to connect with patients for Virtual Visits (Telemedicine).  Patients are able to view lab/test results, encounter notes, upcoming appointments, etc.  Non-urgent messages can be sent to your provider as well.   To learn more about what you can do with MyChart, go to [email or web address].    Your next appointment:   6 week(s)  Provider:   Wallis Bamberg, NP Rosalita Levan) or Revankar   Other Instructions

## 2023-08-08 NOTE — Progress Notes (Signed)
Subjective:   Victoria Barajas is a 77 y.o. female who presents for an Initial Medicare Annual Wellness Visit.  Visit Complete: Virtual I connected with  Raneem Parillo on 08/08/23 by a audio enabled telemedicine application and verified that I am speaking with the correct person using two identifiers.  Patient Location: Home  Provider Location: Home Office  I discussed the limitations of evaluation and management by telemedicine. The patient expressed understanding and agreed to proceed.  Vital Signs: Because this visit was a virtual/telehealth visit, some criteria may be missing or patient reported. Any vitals not documented were not able to be obtained and vitals that have been documented are patient reported  Cardiac Risk Factors include: advanced age (>50men, >45 women);hypertension     Objective:    Today's Vitals   08/08/23 0904  PainSc: 3    There is no height or weight on file to calculate BMI.     08/08/2023    9:11 AM  Advanced Directives  Does Patient Have a Medical Advance Directive? No  Would patient like information on creating a medical advance directive? No - Patient declined    Current Medications (verified) Outpatient Encounter Medications as of 08/08/2023  Medication Sig   aspirin 81 MG EC tablet Take 81 mg by mouth daily. Take two 2 pills   fluticasone (FLONASE) 50 MCG/ACT nasal spray Place 2 sprays into both nostrils daily.   lisinopril (ZESTRIL) 20 MG tablet Take 1 tablet (20 mg total) by mouth daily.   loratadine (CLARITIN) 10 MG tablet Take 1 tablet (10 mg total) by mouth daily.   naproxen sodium (ALEVE) 220 MG tablet Take 220 mg by mouth daily as needed for headache.   nitroGLYCERIN (NITROSTAT) 0.4 MG SL tablet Place 0.4 mg under the tongue every 5 (five) minutes as needed for chest pain.   omeprazole (PRILOSEC) 40 MG capsule TAKE 1 CAPSULE BY MOUTH EVERY NIGHT AT BEDTIME   polyethylene glycol (MIRALAX / GLYCOLAX) packet Take 17 g by mouth daily as  needed for diarrhea or loose stools.   rosuvastatin (CRESTOR) 20 MG tablet Take 1 tablet (20 mg total) by mouth daily.   aspirin-acetaminophen-caffeine (EXCEDRIN MIGRAINE) 250-250-65 MG tablet Take 1 tablet by mouth every 6 (six) hours as needed for migraine. (Patient not taking: Reported on 08/08/2023)   No facility-administered encounter medications on file as of 08/08/2023.    Allergies (verified) Prolia [denosumab], Alendronate, and Tape   History: Past Medical History:  Diagnosis Date   Age-related nuclear cataract of both eyes 01/06/2016   Aortic atherosclerosis (HCC)    2024   BMI 36.0-36.9,adult 03/11/2020   Dyspnea on exertion 12/22/2021   Essential hypertension 11/25/2017   Gastroesophageal reflux disease without esophagitis 01/06/2016   High cholesterol    History of pancreatitis    History of shingles    LVH (left ventricular hypertrophy) 03/11/2020   Migraine headache 01/06/2016   Mixed hyperlipidemia 01/06/2016   Morbid obesity (HCC) 09/09/2020   Myopia of both eyes 01/06/2016   Osteoporosis 01/06/2016   Plantar fasciitis, left 01/06/2016   Presbyopia of both eyes 01/06/2016   Rectocele 05/23/2016   Rupture of flexor tendon of finger 10/28/2020   Spondylosis with myelopathy, lumbar region 11/12/2019   Urgency incontinence 06/13/2021   Past Surgical History:  Procedure Laterality Date   ABDOMINAL HYSTERECTOMY     CHOLECYSTECTOMY  04/08/2023   TUBAL LIGATION     WRIST SURGERY     Family History  Problem Relation Age of Onset  Heart Problems Mother    Diabetes Father    Diabetes Sister    Kidney disease Sister    Diabetes Brother    Alzheimer's disease Brother    Heart attack Brother    Social History   Socioeconomic History   Marital status: Married    Spouse name: Not on file   Number of children: Not on file   Years of education: Not on file   Highest education level: Not on file  Occupational History   Not on file  Tobacco Use   Smoking  status: Former    Current packs/day: 0.00    Types: Cigarettes    Quit date: 1993    Years since quitting: 31.8   Smokeless tobacco: Never  Substance and Sexual Activity   Alcohol use: No   Drug use: No   Sexual activity: Not Currently  Other Topics Concern   Not on file  Social History Narrative   Not on file   Social Determinants of Health   Financial Resource Strain: Low Risk  (08/08/2023)   Overall Financial Resource Strain (CARDIA)    Difficulty of Paying Living Expenses: Not hard at all  Food Insecurity: No Food Insecurity (08/08/2023)   Hunger Vital Sign    Worried About Running Out of Food in the Last Year: Never true    Ran Out of Food in the Last Year: Never true  Transportation Needs: No Transportation Needs (08/08/2023)   PRAPARE - Administrator, Civil Service (Medical): No    Lack of Transportation (Non-Medical): No  Physical Activity: Inactive (08/08/2023)   Exercise Vital Sign    Days of Exercise per Week: 0 days    Minutes of Exercise per Session: 0 min  Stress: No Stress Concern Present (08/08/2023)   [employer] of Occupational Health - Occupational Stress Questionnaire    Feeling of Stress : Not at all  Social Connections: Moderately Isolated (08/08/2023)   Social Connection and Isolation Panel [NHANES]    Frequency of Communication with Friends and Family: Three times a week    Frequency of Social Gatherings with Friends and Family: Never    Attends Religious Services: Never    Database administrator or Organizations: No    Attends Engineer, structural: Never    Marital Status: Married    Tobacco Counseling Counseling given: Not Answered   Clinical Intake:  Pre-visit preparation completed: Yes  Pain : 0-10 Pain Score: 3  Pain Location: Hip Pain Orientation: Right Pain Descriptors / Indicators: Burning, Aching Pain Onset: More than a month ago Pain Frequency: Intermittent     Diabetes: No  How often do you  need to have someone help you when you read instructions, pamphlets, or other written materials from your doctor or pharmacy?: 1 - Never  Interpreter Needed?: No  Information entered by :: Remi Haggard LPN   Activities of Daily Living    08/08/2023    9:10 AM 01/29/2023   10:51 AM  In your present state of health, do you have any difficulty performing the following activities:  Hearing? 0 0  Vision? 0 0  Difficulty concentrating or making decisions? 0 0  Walking or climbing stairs? 0 0  Dressing or bathing? 0 0  Doing errands, shopping? 0 0  Preparing Food and eating ? N   Using the Toilet? N   In the past six months, have you accidently leaked urine? Y   Do you have problems with loss of  bowel control? N   Managing your Medications? N   Managing your Finances? N   Housekeeping or managing your Housekeeping? N     Patient Care Team: Blane Ohara, MD as PCP - General (Family Medicine) Earvin Hansen, [facility] (Inactive) as Pharmacist (Pharmacist) Revankar, Aundra Dubin, MD as Consulting Physician (Cardiology)  Indicate any recent Medical Services you may have received from other than [facility] providers in the past year (date may be approximate).     Assessment:   This is a routine wellness examination for Malayla.  Hearing/Vision screen Hearing Screening - Comments:: No trouble hearing Vision Screening - Comments:: Up to date [facility]   Goals Addressed             This Visit's Progress    Weight (lb) < 200 lb (90.7 kg)         Depression Screen    08/08/2023    9:29 AM 04/24/2023    3:56 PM 01/29/2023   10:51 AM 12/19/2021   10:15 AM 10/21/2020    9:43 AM 02/26/2020    9:51 AM  PHQ 2/9 Scores  PHQ - 2 Score 0 0 0 0 0 0  PHQ- 9 Score 0 4        Fall Risk    08/08/2023    9:06 AM 04/24/2023    3:56 PM 01/29/2023   11:13 AM 07/04/2022    8:22 AM 07/04/2022    8:13 AM  Fall Risk   Falls in the past year? 1 1 0 0 0  Number falls in past yr: 0 0 0 0 0  Injury with Fall?  1 0 0 0 0  Risk for fall due to :  No Fall Risks No Fall Risks  Impaired balance/gait  Follow up Falls evaluation completed;Education provided;Falls prevention discussed Falls evaluation completed Falls evaluation completed  Education provided    MEDICARE RISK AT HOME: Medicare Risk at Home Any stairs in or around the home?: No If so, are there any without handrails?: No Home free of loose throw rugs in walkways, pet beds, electrical cords, etc?: Yes Adequate lighting in your home to reduce risk of falls?: Yes Life alert?: No Use of a cane, walker or w/c?: No Grab bars in the bathroom?: Yes Shower chair or bench in shower?: Yes Elevated toilet seat or a handicapped toilet?: No  TIMED UP AND GO:  Was the test performed? No    Cognitive Function:        08/08/2023    9:13 AM  6CIT Screen  What Year? 0 points  What month? 0 points  What time? 0 points  Count back from 20 0 points  Months in reverse 0 points  Repeat phrase 0 points  Total Score 0 points    Immunizations Immunization History  Administered Date(s) Administered   Influenza, High Dose Seasonal PF 07/11/2016, 07/25/2017, 07/22/2018, 08/28/2019   Influenza-Unspecified 08/14/2013   Moderna Sars-Covid-2 Vaccination 12/11/2019, 01/08/2020   Pneumococcal Conjugate-13 07/11/2016   Pneumococcal Polysaccharide-23 02/04/2014   Tdap 08/22/2010, 12/07/2021   Zoster Recombinant(Shingrix) 12/19/2021, 06/19/2023    TDAP status: Up to date  Flu Vaccine status: Declined, Education has been provided regarding the importance of this vaccine but patient still declined. Advised may receive this vaccine at local pharmacy or Health Dept. Aware to provide a copy of the vaccination record if obtained from local pharmacy or Health Dept. Verbalized acceptance and understanding.  Pneumococcal vaccine status: Up to date  Covid-19 vaccine status: Declined, Education has  been provided regarding the importance of this vaccine but  patient still declined. Advised may receive this vaccine at local pharmacy or Health Dept.or vaccine clinic. Aware to provide a copy of the vaccination record if obtained from local pharmacy or Health Dept. Verbalized acceptance and understanding.  Qualifies for Shingles Vaccine? No   Zostavax completed Yes   Shingrix Completed?: Yes  Screening Tests Health Maintenance  Topic Date Due   DEXA SCAN  Never done   INFLUENZA VACCINE  01/06/2024 (Originally 05/09/2023)   MAMMOGRAM  02/14/2024   Medicare Annual Wellness (AWV)  08/07/2024   DTaP/Tdap/Td (3 - Td or Tdap) 12/08/2031   Pneumonia Vaccine 45+ Years old  Completed   Hepatitis C Screening  Completed   Zoster Vaccines- Shingrix  Completed   HPV VACCINES  Aged Out   COVID-19 Vaccine  Discontinued    Health Maintenance  Health Maintenance Due  Topic Date Due   DEXA SCAN  Never done   2 Colorectal cancer screening: No longer required.   Mammogram status: Completed  . Repeat every year  Bone Density up to date  patient will call to have report sent.       Lung Cancer Screening: (Low Dose CT Chest recommended if Age 59-80 years, 20 pack-year currently smoking OR have quit w/in 15years.) does not qualify.   Lung Cancer Screening Referral:   Additional Screening:  Hepatitis C Screening: does not qualify; Completed 2023  Vision Screening: Recommended annual ophthalmology exams for early detection of glaucoma and other disorders of the eye. Is the patient up to date with their annual eye exam?  Yes  Who is the provider or what is the name of the office in which the patient attends annual eye exams? [facility] If pt is not established with a provider, would they like to be referred to a provider to establish care? No .   Dental Screening: Recommended annual dental exams for proper oral hygiene    Community Resource Referral / Chronic Care Management: CRR required this visit?  No   CCM required this visit?  No     Plan:      I have personally reviewed and noted the following in the patient's chart:   Medical and social history Use of alcohol, tobacco or illicit drugs  Current medications and supplements including opioid prescriptions. Patient is not currently taking opioid prescriptions. Functional ability and status Nutritional status Physical activity Advanced directives List of other physicians Hospitalizations, surgeries, and ER visits in previous 12 months Vitals Screenings to include cognitive, depression, and falls Referrals and appointments  In addition, I have reviewed and discussed with patient certain preventive protocols, quality metrics, and best practice recommendations. A written personalized care plan for preventive services as well as general preventive health recommendations were provided to patient.     Remi Haggard, LPN   16/07/[id number]   After Visit Summary: (MyChart) Due to this being a telephonic visit, the after visit summary with patients personalized plan was offered to patient via MyChart   Nurse Notes: patient states she had her Bone Density will call to get report sent .

## 2023-08-08 NOTE — Patient Instructions (Signed)
Victoria Barajas , Thank you for taking time to come for your Medicare Wellness Visit. I appreciate your ongoing commitment to your health goals. Please review the following plan we discussed and let me know if I can assist you in the future.   Screening recommendations/referrals: Colonoscopy:  Mammogram:  Bone Density:  Recommended yearly ophthalmology/optometry visit for glaucoma screening and checkup Recommended yearly dental visit for hygiene and checkup  Vaccinations: Influenza vaccine:  Pneumococcal vaccine:  Tdap vaccine:  Shingles vaccine:    Preventive Care 65 Years and Older, Female Preventive care refers to lifestyle choices and visits with your health care provider that can promote health and wellness. What does preventive care include? A yearly physical exam. This is also called an annual well check. Dental exams once or twice a year. Routine eye exams. Ask your health care provider how often you should have your eyes checked. Personal lifestyle choices, including: Daily care of your teeth and gums. Regular physical activity. Eating a healthy diet. Avoiding tobacco and drug use. Limiting alcohol use. Practicing safe sex. Taking low-dose aspirin every day. Taking vitamin and mineral supplements as recommended by your health care provider. What happens during an annual well check? The services and screenings done by your health care provider during your annual well check will depend on your age, overall health, lifestyle risk factors, and family history of disease. Counseling  Your health care provider may ask you questions about your: Alcohol use. Tobacco use. Drug use. Emotional well-being. Home and relationship well-being. Sexual activity. Eating habits. History of falls. Memory and ability to understand (cognition). Work and work Astronomer. Reproductive health. Screening  You may have the following tests or measurements: Height, weight, and BMI. Blood  pressure. Lipid and cholesterol levels. These may be checked every 5 years, or more frequently if you are over 74 years old. Skin check. Lung cancer screening. You may have this screening every year starting at age 76 if you have a 30-pack-year history of smoking and currently smoke or have quit within the past 15 years. Fecal occult blood test (FOBT) of the stool. You may have this test every year starting at age 76. Flexible sigmoidoscopy or colonoscopy. You may have a sigmoidoscopy every 5 years or a colonoscopy every 10 years starting at age 54. Hepatitis C blood test. Hepatitis B blood test. Sexually transmitted disease (STD) testing. Diabetes screening. This is done by checking your blood sugar (glucose) after you have not eaten for a while (fasting). You may have this done every 1-3 years. Bone density scan. This is done to screen for osteoporosis. You may have this done starting at age 76. Mammogram. This may be done every 1-2 years. Talk to your health care provider about how often you should have regular mammograms. Talk with your health care provider about your test results, treatment options, and if necessary, the need for more tests. Vaccines  Your health care provider may recommend certain vaccines, such as: Influenza vaccine. This is recommended every year. Tetanus, diphtheria, and acellular pertussis (Tdap, Td) vaccine. You may need a Td booster every 10 years. Zoster vaccine. You may need this after age 70. Pneumococcal 13-valent conjugate (PCV13) vaccine. One dose is recommended after age 76. Pneumococcal polysaccharide (PPSV23) vaccine. One dose is recommended after age 76. Talk to your health care provider about which screenings and vaccines you need and how often you need them. This information is not intended to replace advice given to you by your health care provider. Make sure  you discuss any questions you have with your health care provider. Document Released:  10/21/2015 Document Revised: 06/13/2016 Document Reviewed: 07/26/2015 Elsevier Interactive Patient Education  2017 [employer].  Fall Prevention in the Home Falls can cause injuries. They can happen to people of all ages. There are many things you can do to make your home safe and to help prevent falls. What can I do on the outside of my home? Regularly fix the edges of walkways and driveways and fix any cracks. Remove anything that might make you trip as you walk through a door, such as a raised step or threshold. Trim any bushes or trees on the path to your home. Use bright outdoor lighting. Clear any walking paths of anything that might make someone trip, such as rocks or tools. Regularly check to see if handrails are loose or broken. Make sure that both sides of any steps have handrails. Any raised decks and porches should have guardrails on the edges. Have any leaves, snow, or ice cleared regularly. Use sand or salt on walking paths during winter. Clean up any spills in your garage right away. This includes oil or grease spills. What can I do in the bathroom? Use night lights. Install grab bars by the toilet and in the tub and shower. Do not use towel bars as grab bars. Use non-skid mats or decals in the tub or shower. If you need to sit down in the shower, use a plastic, non-slip stool. Keep the floor dry. Clean up any water that spills on the floor as soon as it happens. Remove soap buildup in the tub or shower regularly. Attach bath mats securely with double-sided non-slip rug tape. Do not have throw rugs and other things on the floor that can make you trip. What can I do in the bedroom? Use night lights. Make sure that you have a light by your bed that is easy to reach. Do not use any sheets or blankets that are too big for your bed. They should not hang down onto the floor. Have a firm chair that has side arms. You can use this for support while you get dressed. Do not have  throw rugs and other things on the floor that can make you trip. What can I do in the kitchen? Clean up any spills right away. Avoid walking on wet floors. Keep items that you use a lot in easy-to-reach places. If you need to reach something above you, use a strong step stool that has a grab bar. Keep electrical cords out of the way. Do not use floor polish or wax that makes floors slippery. If you must use wax, use non-skid floor wax. Do not have throw rugs and other things on the floor that can make you trip. What can I do with my stairs? Do not leave any items on the stairs. Make sure that there are handrails on both sides of the stairs and use them. Fix handrails that are broken or loose. Make sure that handrails are as long as the stairways. Check any carpeting to make sure that it is firmly attached to the stairs. Fix any carpet that is loose or worn. Avoid having throw rugs at the top or bottom of the stairs. If you do have throw rugs, attach them to the floor with carpet tape. Make sure that you have a light switch at the top of the stairs and the bottom of the stairs. If you do not have them, ask someone  to add them for you. What else can I do to help prevent falls? Wear shoes that: Do not have high heels. Have rubber bottoms. Are comfortable and fit you well. Are closed at the toe. Do not wear sandals. If you use a stepladder: Make sure that it is fully opened. Do not climb a closed stepladder. Make sure that both sides of the stepladder are locked into place. Ask someone to hold it for you, if possible. Clearly mark and make sure that you can see: Any grab bars or handrails. First and last steps. Where the edge of each step is. Use tools that help you move around (mobility aids) if they are needed. These include: Canes. Walkers. Scooters. Crutches. Turn on the lights when you go into a dark area. Replace any light bulbs as soon as they burn out. Set up your furniture so  you have a clear path. Avoid moving your furniture around. If any of your floors are uneven, fix them. If there are any pets around you, be aware of where they are. Review your medicines with your doctor. Some medicines can make you feel dizzy. This can increase your chance of falling. Ask your doctor what other things that you can do to help prevent falls. This information is not intended to replace advice given to you by your health care provider. Make sure you discuss any questions you have with your health care provider. Document Released: 07/21/2009 Document Revised: 03/01/2016 Document Reviewed: 10/29/2014 Elsevier Interactive Patient Education  2017 [employer].

## 2023-08-15 ENCOUNTER — Telehealth: Payer: Self-pay | Admitting: *Deleted

## 2023-08-15 NOTE — Telephone Encounter (Signed)
She states she is doing pretty good. Medication we sent in is helping. Feeling more energetic and having less palpitations. Pt has appointment in December with Dr Tomie China. She will let us know if she needs anything or needs to be seen sooner.

## 2023-08-18 DIAGNOSIS — Z0000 Encounter for general adult medical examination without abnormal findings: Secondary | ICD-10-CM | POA: Insufficient documentation

## 2023-08-18 NOTE — Assessment & Plan Note (Signed)
Education given. 

## 2023-08-21 ENCOUNTER — Ambulatory Visit: Payer: Medicare Other | Admitting: Cardiology

## 2023-08-28 DIAGNOSIS — R002 Palpitations: Secondary | ICD-10-CM | POA: Diagnosis not present

## 2023-08-28 DIAGNOSIS — I10 Essential (primary) hypertension: Secondary | ICD-10-CM | POA: Diagnosis not present

## 2023-09-04 ENCOUNTER — Telehealth: Payer: Self-pay

## 2023-09-04 DIAGNOSIS — I4729 Other ventricular tachycardia: Secondary | ICD-10-CM

## 2023-09-04 NOTE — Addendum Note (Signed)
Addended by: Belva Crome R on: 09/04/2023 03:10 PM   Modules accepted: Orders

## 2023-09-04 NOTE — Telephone Encounter (Signed)
Results reviewed with pt as per Dr. Revankar's note.  Pt verbalized understanding and had no additional questions. Routed to PCP.  

## 2023-09-04 NOTE — Addendum Note (Signed)
Addended by: Eleonore Chiquito on: 09/04/2023 03:00 PM   Modules accepted: Orders

## 2023-09-04 NOTE — Telephone Encounter (Signed)
-----   Message from Aundra Dubin Revankar sent at 09/04/2023  2:13 PM EST ----- Best to get the patient in for a Lexiscan sestamibi or exercise stress Cardiolite.  Lets do magnesium level and Chem-7 when he comes in.  Copy primary care Garwin Brothers, MD 09/04/2023 2:13 PM

## 2023-09-17 ENCOUNTER — Encounter: Payer: Self-pay | Admitting: Family Medicine

## 2023-09-24 ENCOUNTER — Ambulatory Visit: Payer: Medicare Other | Admitting: Cardiology

## 2023-10-08 ENCOUNTER — Encounter (HOSPITAL_COMMUNITY): Payer: Self-pay

## 2023-10-16 ENCOUNTER — Ambulatory Visit: Payer: Medicare Other | Attending: Cardiology

## 2023-10-16 VITALS — BP 128/84 | Ht 66.0 in | Wt 208.0 lb

## 2023-10-16 DIAGNOSIS — I498 Other specified cardiac arrhythmias: Secondary | ICD-10-CM

## 2023-10-16 DIAGNOSIS — I4729 Other ventricular tachycardia: Secondary | ICD-10-CM | POA: Diagnosis not present

## 2023-10-16 MED ORDER — TECHNETIUM TC 99M TETROFOSMIN IV KIT
10.8000 | PACK | Freq: Once | INTRAVENOUS | Status: AC | PRN
Start: 2023-10-16 — End: 2023-10-16
  Administered 2023-10-16: 10.8 via INTRAVENOUS

## 2023-10-16 MED ORDER — TECHNETIUM TC 99M TETROFOSMIN IV KIT
28.6000 | PACK | Freq: Once | INTRAVENOUS | Status: AC | PRN
  Administered 2023-10-16: 28.6 via INTRAVENOUS

## 2023-10-16 MED ORDER — REGADENOSON 0.4 MG/5ML IV SOLN
0.4000 mg | Freq: Once | INTRAVENOUS | Status: AC
  Administered 2023-10-16: 0.4 mg via INTRAVENOUS

## 2023-10-17 LAB — MYOCARDIAL PERFUSION IMAGING
LV dias vol: 107 mL (ref 46–106)
LV sys vol: 52 mL
Nuc Stress EF: 51 %
Peak HR: 91 {beats}/min
Rest HR: 60 {beats}/min
Rest Nuclear Isotope Dose: 10.8 mCi
SDS: 0
SRS: 5
SSS: 5
Stress Nuclear Isotope Dose: 28.6 mCi
TID: 0.91

## 2023-11-05 ENCOUNTER — Other Ambulatory Visit: Payer: Self-pay

## 2023-11-05 DIAGNOSIS — E782 Mixed hyperlipidemia: Secondary | ICD-10-CM

## 2023-11-05 MED ORDER — ROSUVASTATIN CALCIUM 20 MG PO TABS: 20.0000 mg | ORAL_TABLET | Freq: Every day | ORAL | 0 refills | Status: DC

## 2023-11-21 ENCOUNTER — Ambulatory Visit: Payer: Medicare Other | Admitting: Family Medicine

## 2023-11-21 ENCOUNTER — Encounter: Payer: Self-pay | Admitting: Family Medicine

## 2023-11-21 VITALS — BP 136/68 | HR 55 | Temp 96.8°F | Ht 66.0 in | Wt 212.0 lb

## 2023-11-21 DIAGNOSIS — I480 Paroxysmal atrial fibrillation: Secondary | ICD-10-CM | POA: Diagnosis not present

## 2023-11-21 DIAGNOSIS — E66811 Obesity, class 1: Secondary | ICD-10-CM

## 2023-11-21 DIAGNOSIS — M5431 Sciatica, right side: Secondary | ICD-10-CM | POA: Diagnosis not present

## 2023-11-21 DIAGNOSIS — E782 Mixed hyperlipidemia: Secondary | ICD-10-CM | POA: Diagnosis not present

## 2023-11-21 DIAGNOSIS — E119 Type 2 diabetes mellitus without complications: Secondary | ICD-10-CM

## 2023-11-21 DIAGNOSIS — I10 Essential (primary) hypertension: Secondary | ICD-10-CM | POA: Diagnosis not present

## 2023-11-21 DIAGNOSIS — K219 Gastro-esophageal reflux disease without esophagitis: Secondary | ICD-10-CM | POA: Diagnosis not present

## 2023-11-21 DIAGNOSIS — Z111 Encounter for screening for respiratory tuberculosis: Secondary | ICD-10-CM

## 2023-11-21 DIAGNOSIS — E6609 Other obesity due to excess calories: Secondary | ICD-10-CM

## 2023-11-21 DIAGNOSIS — Z6834 Body mass index (BMI) 34.0-34.9, adult: Secondary | ICD-10-CM

## 2023-11-21 MED ORDER — NAPROXEN 500 MG PO TABS: ORAL_TABLET | ORAL | 0 refills | Status: AC

## 2023-11-21 NOTE — Progress Notes (Signed)
Subjective:  Patient ID: Victoria Barajas, female    DOB: [birth year]  Age: 78 y.o. MRN: [id number]  Chief Complaint  Patient presents with   Medical Management of Chronic Issues    HPI The patient, with a history of hyperlipidemia, hypertension, gastroesophageal reflux disease (GERD), hiatal hernia, and newly diagnosed diabetes mellitus, presents for a regular follow-up. She reports a significant worsening of her chronic back pain, which she attributes to her scoliosis. The pain is localized to the right side of her back and is described as 'horrible' in the mornings, improving slightly with movement. She also reports numbness radiating down to her right knee. The patient has tried over-the-counter Aleve and Tylenol for pain relief, with minimal effect.  The patient also reports episodes of chest discomfort and palpitations, which she describes as her heart's 'bottom and top beats' getting out of sync. These episodes have led to emergency room visits and follow-ups with her cardiologist, who started her on metoprolol. She has had two episodes since starting the medication, but these were shorter in duration.  Regarding her newly diagnosed diabetes, the patient reports being well-informed about the condition due to a family history of diabetes. She is aware of dietary modifications but admits that adherence is challenging. She has been having regular annual eye exams and will inform her optometrist about her diabetes diagnosis. A1C 6.6  The patient also mentions a history of migraine headaches, which have improved since her retirement. She used to take Excedrin Migraine for relief. She also reports a history of pancreatitis and gallbladder removal.High Cholesterol: Crestor 20 mg daily and aspirin 81 mg daily.    GERD: Prilosec 40 mg daily. Hiatal hernia   HTN: Lisinopril 20 mg daily, metoprolol 25 mg daily   Morbid obesity: eating healthy.    Allergic rhinitis: on flonase and claritin.        08/08/2023    9:29 AM 04/24/2023    3:56 PM 01/29/2023   10:51 AM 12/19/2021   10:15 AM 10/21/2020    9:43 AM  Depression screen PHQ 2/9  Decreased Interest 0 0 0 0 0  Down, Depressed, Hopeless 0 0 0 0 0  PHQ - 2 Score 0 0 0 0 0  Altered sleeping 0 1     Tired, decreased energy 0 3     Change in appetite 0 0     Feeling bad or failure about yourself  0 0     Trouble concentrating 0 0     Moving slowly or fidgety/restless 0 0     Suicidal thoughts 0 0     PHQ-9 Score 0 4     Difficult doing work/chores Not difficult at all Not difficult at all           08/08/2023    9:06 AM  Fall Risk   Falls in the past year? 1  Number falls in past yr: 0  Injury with Fall? 1  Follow up Falls evaluation completed;Education provided;Falls prevention discussed    Patient Care Team: Blane Ohara, MD as PCP - General (Family Medicine) Revankar, Aundra Dubin, MD as PCP - Cardiology (Cardiology) Earvin Hansen, [facility] (Inactive) as Pharmacist (Pharmacist) Revankar, Aundra Dubin, MD as Consulting Physician (Cardiology)   Review of Systems  Constitutional:  Negative for chills, fatigue and fever.  HENT:  Negative for congestion, ear pain and sore throat.   Respiratory:  Negative for cough and shortness of breath.   Cardiovascular:  Negative for chest pain.  Gastrointestinal:  Negative for abdominal pain, constipation, diarrhea, nausea and vomiting.  Genitourinary:  Negative for dysuria and urgency.  Musculoskeletal:  Negative for arthralgias and myalgias.  Neurological:  Negative for dizziness and headaches.  Psychiatric/Behavioral:  Negative for dysphoric mood. The patient is not nervous/anxious.     Current Outpatient Medications on File Prior to Visit  Medication Sig Dispense Refill   aspirin EC 81 MG tablet Take 162 mg by mouth daily. Swallow whole.     aspirin-acetaminophen-caffeine (EXCEDRIN MIGRAINE) 250-250-65 MG tablet Take 1 tablet by mouth every 6 (six) hours as needed for migraine.      fluticasone (FLONASE) 50 MCG/ACT nasal spray Place 2 sprays into both nostrils daily. 16 g 6   lisinopril (ZESTRIL) 20 MG tablet Take 1 tablet (20 mg total) by mouth daily. 90 tablet 2   loratadine (CLARITIN) 10 MG tablet Take 1 tablet (10 mg total) by mouth daily. 30 tablet 11   metoprolol succinate (TOPROL XL) 25 MG 24 hr tablet Take 1 tablet (25 mg total) by mouth daily. 90 tablet 3   nitroGLYCERIN (NITROSTAT) 0.4 MG SL tablet Place 0.4 mg under the tongue every 5 (five) minutes as needed for chest pain.     omeprazole (PRILOSEC) 40 MG capsule TAKE 1 CAPSULE BY MOUTH EVERY NIGHT AT BEDTIME 90 capsule 2   polyethylene glycol (MIRALAX / GLYCOLAX) packet Take 17 g by mouth daily as needed for diarrhea or loose stools.     rosuvastatin (CRESTOR) 20 MG tablet Take 1 tablet (20 mg total) by mouth daily. 30 tablet 0   No current facility-administered medications on file prior to visit.   Past Medical History:  Diagnosis Date   Age-related nuclear cataract of both eyes 01/06/2016   Aortic atherosclerosis (HCC)    2024   BMI 36.0-36.9,adult 03/11/2020   Dyspnea on exertion 12/22/2021   Essential hypertension 11/25/2017   Gastroesophageal reflux disease without esophagitis 01/06/2016   High cholesterol    History of pancreatitis    History of shingles    LVH (left ventricular hypertrophy) 03/11/2020   Migraine headache 01/06/2016   Mixed hyperlipidemia 01/06/2016   Morbid obesity (HCC) 09/09/2020   Myopia of both eyes 01/06/2016   Osteoporosis 01/06/2016   Plantar fasciitis, left 01/06/2016   Presbyopia of both eyes 01/06/2016   Rectocele 05/23/2016   Rupture of flexor tendon of finger 10/28/2020   Spondylosis with myelopathy, lumbar region 11/12/2019   Urgency incontinence 06/13/2021   Past Surgical History:  Procedure Laterality Date   ABDOMINAL HYSTERECTOMY     CHOLECYSTECTOMY  04/08/2023   TUBAL LIGATION     WRIST SURGERY      Family History  Problem Relation Age of Onset    Heart Problems Mother    Diabetes Father    Diabetes Sister    Kidney disease Sister    Diabetes Brother    Alzheimer's disease Brother    Heart attack Brother    Social History   Socioeconomic History   Marital status: Married    Spouse name: Not on file   Number of children: Not on file   Years of education: Not on file   Highest education level: Not on file  Occupational History   Not on file  Tobacco Use   Smoking status: Former    Current packs/day: 0.00    Types: Cigarettes    Quit date: 1993    Years since quitting: 32.1   Smokeless tobacco: Never  Substance and Sexual Activity   Alcohol  use: No   Drug use: No   Sexual activity: Not Currently  Other Topics Concern   Not on file  Social History Narrative   Not on file   Social Drivers of Health   Financial Resource Strain: Low Risk  (08/08/2023)   Overall Financial Resource Strain (CARDIA)    Difficulty of Paying Living Expenses: Not hard at all  Food Insecurity: No Food Insecurity (08/08/2023)   Hunger Vital Sign    Worried About Running Out of Food in the Last Year: Never true    Ran Out of Food in the Last Year: Never true  Transportation Needs: No Transportation Needs (08/08/2023)   PRAPARE - Administrator, Civil Service (Medical): No    Lack of Transportation (Non-Medical): No  Physical Activity: Inactive (08/08/2023)   Exercise Vital Sign    Days of Exercise per Week: 0 days    Minutes of Exercise per Session: 0 min  Stress: No Stress Concern Present (08/08/2023)   [employer] of Occupational Health - Occupational Stress Questionnaire    Feeling of Stress : Not at all  Social Connections: Moderately Isolated (08/08/2023)   Social Connection and Isolation Panel [NHANES]    Frequency of Communication with Friends and Family: Three times a week    Frequency of Social Gatherings with Friends and Family: Never    Attends Religious Services: Never    Programmer, systems: No    Attends Engineer, structural: Never    Marital Status: Married    Objective:  BP 136/68   Pulse (!) 55   Temp (!) 96.8 F (36 C)   Ht 5\' 6"  (1.676 m)   Wt 212 lb (96.2 kg)   SpO2 98%   BMI 34.22 kg/m      11/21/2023   10:24 AM 10/16/2023    8:31 AM 08/08/2023    5:05 PM  BP/Weight  Systolic BP 136  161  Diastolic BP 68  80  Wt. (Lbs) 212 208   BMI 34.22 kg/m2 33.57 kg/m2     Physical Exam Vitals reviewed.  Constitutional:      Appearance: Normal appearance. She is obese.  Neck:     Vascular: No carotid bruit.  Cardiovascular:     Rate and Rhythm: Normal rate and regular rhythm.     Heart sounds: Normal heart sounds.  Pulmonary:     Effort: Pulmonary effort is normal. No respiratory distress.     Breath sounds: Normal breath sounds.  Abdominal:     General: Abdomen is flat. Bowel sounds are normal.     Palpations: Abdomen is soft.     Tenderness: There is no abdominal tenderness.  Musculoskeletal:        General: Tenderness (lumbar rt side. positive SLR on rt side. negatie Left side SLR. nontender over trochanteric bursa BL.) present.  Neurological:     Mental Status: She is alert and oriented to person, place, and time.  Psychiatric:        Mood and Affect: Mood normal.        Behavior: Behavior normal.     Diabetic Foot Exam - Simple   Simple Foot Form  11/21/2023  8:18 PM  Visual Inspection No deformities, no ulcerations, no other skin breakdown bilaterally: Yes Sensation Testing Intact to touch and monofilament testing bilaterally: Yes Pulse Check Posterior Tibialis and Dorsalis pulse intact bilaterally: Yes Comments      Lab Results  Component Value Date  WBC 5.4 11/21/2023   HGB 13.8 11/21/2023   HCT 43.1 11/21/2023   PLT 227 11/21/2023   GLUCOSE 93 11/21/2023   CHOL 145 11/21/2023   TRIG 103 11/21/2023   HDL 54 11/21/2023   LDLCALC 72 11/21/2023   ALT 13 11/21/2023   AST 18 11/21/2023   NA 144 11/21/2023    K 5.1 11/21/2023   CL 105 11/21/2023   CREATININE 0.97 11/21/2023   BUN 16 11/21/2023   CO2 25 11/21/2023   TSH 1.940 04/24/2023   INR 0.95 11/08/2017   HGBA1C 6.2 (H) 11/21/2023      Assessment & Plan:    Sciatica of right side Assessment & Plan: Exacerbation of chronic back pain, likely secondary to scoliosis and nerve impingement. Patient reports numbness radiating down to the knee. Current management with over-the-counter Aleve and Tylenol provides partial relief. -Prescribe Naproxen (prescription strength Aleve). -Refer to physical therapy. -Consider further imaging if no improvement with conservative management.  Orders: -     Naproxen; One twice daily with meals for 2 weeks, then twice daily as needed.  Dispense: 60 tablet; Refill: 0 -     Ambulatory referral to Physical Therapy  Essential hypertension Assessment & Plan: On Lisinopril 20mg  daily, Metoprolol 25mg  daily, and Rosuvastatin for management. No recent changes in medication. -Continue current medications. -Check cholesterol levels.   Gastroesophageal reflux disease without esophagitis Assessment & Plan: Managed with Omeprazole 40mg  daily. Patient reports improvement in symptoms. -Continue Omeprazole 40mg  daily.   Mixed hyperlipidemia Assessment & Plan: Continue Crestor.  Check lipid panel in 3 months.   Orders: -     CBC with Differential/Platelet -     Comprehensive metabolic panel -     Lipid panel  Diabetes mellitus without complication [facility]) Assessment & Plan: Newly diagnosed with an A1c of 6.6 in August. Patient is aware of the diagnosis and has family history of diabetes. Patient is knowledgeable about dietary modifications. -Continue dietary modifications. -Check A1c every 3 months. -Annual eye exam. -Check urine for protein annually.  Orders: -     Hemoglobin A1c -     Microalbumin / creatinine urine ratio  Paroxysmal atrial fibrillation (HCC) Assessment & Plan: Recent episodes  of abnormal heart rhythms. Recent stress test and heart monitor results were unremarkable. Metoprolol 25mg  daily was added by the cardiologist. -Continue Metoprolol 25mg  daily.   Class 1 obesity due to excess calories without serious comorbidity with body mass index (BMI) of 34.0 to 34.9 in adult Assessment & Plan: Recommend continue to work on eating healthy diet.       Meds ordered this encounter  Medications   naproxen (NAPROSYN) 500 MG tablet    Sig: One twice daily with meals for 2 weeks, then twice daily as needed.    Dispense:  60 tablet    Refill:  0    Orders Placed This Encounter  Procedures   HM DEXA SCAN   CBC with Differential/Platelet   Comprehensive metabolic panel   Hemoglobin A1c   Lipid panel   Microalbumin / creatinine urine ratio   Ambulatory referral to Physical Therapy     Follow-up: Return in about 3 months (around 02/18/2024).   I,Katherina A Bramblett,acting as a scribe for Blane Ohara, MD.,have documented all relevant documentation on the behalf of Blane Ohara, MD,as directed by  Blane Ohara, MD while in the presence of Blane Ohara, MD.    Clayborn Bigness I Leal-Borjas,acting as a scribe for Blane Ohara, MD.,have documented all relevant documentation on  the behalf of Blane Ohara, MD,as directed by  Blane Ohara, MD while in the presence of Blane Ohara, MD.   An After Visit Summary was printed and given to the patient.  Blane Ohara, MD [facility] [phone]

## 2023-11-21 NOTE — Patient Instructions (Signed)
Statin (500 mg twice daily with meals for 2 weeks and then twice a day as needed. Refer to [facility] here in [place].  Diabetic recommendations: Visits with PCP every 3 months depending on Hemoglobin A1C control (goal is < 7.0.) If your A1c increases above 7, I will recommend checking sugar daily and will get you a glucometer.  Dietary avoidance/limitation of sugar and carbohydrates, fat, and salt. Exercise (aerobic) 3-5 days per week.  Kidney Care:  Urine microalbumin (protein) should be checked at least annually. To treat or prevent nephropathy (leaking of protein in urine) - all patients should be on an ACE or an ARB Medicines. Heart Disease Prevention: All diabetics should be on a statin cholesterol medicine regardless of cholesterol levels. If cholesterol levels are high, recommend treat to goal of LDL cholesterol level of less than 70. Diabetic Eye Care: See an eye doctor at least annually. More if recommended by the eye doctor.  Foot Care: Check feet visually daily. Blood pressure (hypertension) control < 130/85 Vaccinations: annual flu shot, pneumovax 23, tetanus (tdap)

## 2023-11-22 LAB — MICROALBUMIN / CREATININE URINE RATIO
Creatinine, Urine: 141.1 mg/dL
Microalb/Creat Ratio: 9 mg/g{creat} (ref 0–29)
Microalbumin, Urine: 13.3 ug/mL

## 2023-11-22 LAB — CBC WITH DIFFERENTIAL/PLATELET
Basophils Absolute: 0 10*3/uL (ref 0.0–0.2)
Basos: 1 %
EOS (ABSOLUTE): 0.1 10*3/uL (ref 0.0–0.4)
Eos: 2 %
Hematocrit: 43.1 % (ref 34.0–46.6)
Hemoglobin: 13.8 g/dL (ref 11.1–15.9)
Immature Grans (Abs): 0 10*3/uL (ref 0.0–0.1)
Immature Granulocytes: 0 %
Lymphocytes Absolute: 1.1 10*3/uL (ref 0.7–3.1)
Lymphs: 20 %
MCH: 29.1 pg (ref 26.6–33.0)
MCHC: 32 g/dL (ref 31.5–35.7)
MCV: 91 fL (ref 79–97)
Monocytes Absolute: 0.4 10*3/uL (ref 0.1–0.9)
Monocytes: 7 %
Neutrophils Absolute: 3.8 10*3/uL (ref 1.4–7.0)
Neutrophils: 70 %
Platelets: 227 10*3/uL (ref 150–450)
RBC: 4.75 x10E6/uL (ref 3.77–5.28)
RDW: 12.3 % (ref 11.7–15.4)
WBC: 5.4 10*3/uL (ref 3.4–10.8)

## 2023-11-22 LAB — COMPREHENSIVE METABOLIC PANEL
ALT: 13 [IU]/L (ref 0–32)
AST: 18 [IU]/L (ref 0–40)
Albumin: 4.5 g/dL (ref 3.8–4.8)
Alkaline Phosphatase: 73 [IU]/L (ref 44–121)
BUN/Creatinine Ratio: 16 (ref 12–28)
BUN: 16 mg/dL (ref 8–27)
Bilirubin Total: 1.3 mg/dL — ABNORMAL HIGH (ref 0.0–1.2)
CO2: 25 mmol/L (ref 20–29)
Calcium: 10.3 mg/dL (ref 8.7–10.3)
Chloride: 105 mmol/L (ref 96–106)
Creatinine, Ser: 0.97 mg/dL (ref 0.57–1.00)
Globulin, Total: 1.8 g/dL (ref 1.5–4.5)
Glucose: 93 mg/dL (ref 70–99)
Potassium: 5.1 mmol/L (ref 3.5–5.2)
Sodium: 144 mmol/L (ref 134–144)
Total Protein: 6.3 g/dL (ref 6.0–8.5)
eGFR: 61 mL/min/{1.73_m2} (ref >59)

## 2023-11-22 LAB — LIPID PANEL
Chol/HDL Ratio: 2.7 {ratio} (ref 0.0–4.4)
Cholesterol, Total: 145 mg/dL (ref 100–199)
HDL: 54 mg/dL (ref >39)
LDL Chol Calc (NIH): 72 mg/dL (ref 0–99)
Triglycerides: 103 mg/dL (ref 0–149)
VLDL Cholesterol Cal: 19 mg/dL (ref 5–40)

## 2023-11-22 LAB — HEMOGLOBIN A1C
Est. average glucose Bld gHb Est-mCnc: 131 mg/dL
Hgb A1c MFr Bld: 6.2 % — ABNORMAL HIGH (ref 4.8–5.6)

## 2023-11-24 ENCOUNTER — Encounter: Payer: Self-pay | Admitting: Family Medicine

## 2023-11-24 DIAGNOSIS — E66811 Obesity, class 1: Secondary | ICD-10-CM | POA: Insufficient documentation

## 2023-11-24 DIAGNOSIS — M5431 Sciatica, right side: Secondary | ICD-10-CM | POA: Insufficient documentation

## 2023-11-24 DIAGNOSIS — E119 Type 2 diabetes mellitus without complications: Secondary | ICD-10-CM | POA: Insufficient documentation

## 2023-11-24 NOTE — Assessment & Plan Note (Addendum)
Recommend continue to work on eating healthy diet. °

## 2023-11-24 NOTE — Assessment & Plan Note (Signed)
Exacerbation of chronic back pain, likely secondary to scoliosis and nerve impingement. Patient reports numbness radiating down to the knee. Current management with over-the-counter Aleve and Tylenol provides partial relief. -Prescribe Naproxen (prescription strength Aleve). -Refer to physical therapy. -Consider further imaging if no improvement with conservative management.

## 2023-11-24 NOTE — Assessment & Plan Note (Signed)
Recent episodes of abnormal heart rhythms. Recent stress test and heart monitor results were unremarkable. Metoprolol 25mg  daily was added by the cardiologist. -Continue Metoprolol 25mg  daily.

## 2023-11-24 NOTE — Assessment & Plan Note (Signed)
Managed with Omeprazole 40mg  daily. Patient reports improvement in symptoms. -Continue Omeprazole 40mg  daily.

## 2023-11-24 NOTE — Assessment & Plan Note (Addendum)
Continue Crestor.  Check lipid panel in 3 months.

## 2023-11-24 NOTE — Assessment & Plan Note (Signed)
On Lisinopril 20mg  daily, Metoprolol 25mg  daily, and Rosuvastatin for management. No recent changes in medication. -Continue current medications. -Check cholesterol levels.

## 2023-11-24 NOTE — Assessment & Plan Note (Signed)
Newly diagnosed with an A1c of 6.6 in August. Patient is aware of the diagnosis and has family history of diabetes. Patient is knowledgeable about dietary modifications. -Continue dietary modifications. -Check A1c every 3 months. -Annual eye exam. -Check urine for protein annually.

## 2023-12-04 DIAGNOSIS — M5431 Sciatica, right side: Secondary | ICD-10-CM | POA: Diagnosis not present

## 2023-12-05 ENCOUNTER — Telehealth: Payer: Self-pay | Admitting: Family Medicine

## 2023-12-05 NOTE — Telephone Encounter (Signed)
 [facility] - [place] - 12/04/2023

## 2023-12-09 ENCOUNTER — Other Ambulatory Visit: Payer: Self-pay | Admitting: Family Medicine

## 2023-12-09 DIAGNOSIS — E782 Mixed hyperlipidemia: Secondary | ICD-10-CM

## 2023-12-09 DIAGNOSIS — M5431 Sciatica, right side: Secondary | ICD-10-CM | POA: Diagnosis not present

## 2023-12-12 DIAGNOSIS — M5431 Sciatica, right side: Secondary | ICD-10-CM | POA: Diagnosis not present

## 2023-12-16 DIAGNOSIS — M5431 Sciatica, right side: Secondary | ICD-10-CM | POA: Diagnosis not present

## 2023-12-19 DIAGNOSIS — M5431 Sciatica, right side: Secondary | ICD-10-CM | POA: Diagnosis not present

## 2023-12-24 DIAGNOSIS — M5431 Sciatica, right side: Secondary | ICD-10-CM | POA: Diagnosis not present

## 2023-12-26 DIAGNOSIS — M5431 Sciatica, right side: Secondary | ICD-10-CM | POA: Diagnosis not present

## 2023-12-30 ENCOUNTER — Other Ambulatory Visit: Payer: Self-pay | Admitting: Family Medicine

## 2023-12-30 DIAGNOSIS — I10 Essential (primary) hypertension: Secondary | ICD-10-CM

## 2023-12-31 DIAGNOSIS — M5431 Sciatica, right side: Secondary | ICD-10-CM | POA: Diagnosis not present

## 2024-01-02 DIAGNOSIS — M5431 Sciatica, right side: Secondary | ICD-10-CM | POA: Diagnosis not present

## 2024-01-07 DIAGNOSIS — M5431 Sciatica, right side: Secondary | ICD-10-CM | POA: Diagnosis not present

## 2024-01-08 DIAGNOSIS — H52203 Unspecified astigmatism, bilateral: Secondary | ICD-10-CM | POA: Diagnosis not present

## 2024-01-08 DIAGNOSIS — H2513 Age-related nuclear cataract, bilateral: Secondary | ICD-10-CM | POA: Diagnosis not present

## 2024-01-08 DIAGNOSIS — H5211 Myopia, right eye: Secondary | ICD-10-CM | POA: Diagnosis not present

## 2024-01-08 DIAGNOSIS — H524 Presbyopia: Secondary | ICD-10-CM | POA: Diagnosis not present

## 2024-01-08 DIAGNOSIS — H02834 Dermatochalasis of left upper eyelid: Secondary | ICD-10-CM | POA: Diagnosis not present

## 2024-01-08 DIAGNOSIS — H11153 Pinguecula, bilateral: Secondary | ICD-10-CM | POA: Diagnosis not present

## 2024-01-08 DIAGNOSIS — H02831 Dermatochalasis of right upper eyelid: Secondary | ICD-10-CM | POA: Diagnosis not present

## 2024-01-08 LAB — HM DIABETES EYE EXAM

## 2024-01-09 DIAGNOSIS — M5431 Sciatica, right side: Secondary | ICD-10-CM | POA: Diagnosis not present

## 2024-01-13 ENCOUNTER — Other Ambulatory Visit: Payer: Self-pay | Admitting: Family Medicine

## 2024-01-13 DIAGNOSIS — E782 Mixed hyperlipidemia: Secondary | ICD-10-CM

## 2024-01-14 DIAGNOSIS — M5431 Sciatica, right side: Secondary | ICD-10-CM | POA: Diagnosis not present

## 2024-01-16 DIAGNOSIS — M5431 Sciatica, right side: Secondary | ICD-10-CM | POA: Diagnosis not present

## 2024-01-22 DIAGNOSIS — M5431 Sciatica, right side: Secondary | ICD-10-CM | POA: Diagnosis not present

## 2024-01-29 DIAGNOSIS — M5431 Sciatica, right side: Secondary | ICD-10-CM | POA: Diagnosis not present

## 2024-02-04 DIAGNOSIS — M5431 Sciatica, right side: Secondary | ICD-10-CM | POA: Diagnosis not present

## 2024-02-13 DIAGNOSIS — M5431 Sciatica, right side: Secondary | ICD-10-CM | POA: Diagnosis not present

## 2024-02-14 ENCOUNTER — Encounter (HOSPITAL_COMMUNITY): Payer: Self-pay

## 2024-02-20 ENCOUNTER — Encounter: Payer: Self-pay | Admitting: Family Medicine

## 2024-02-20 NOTE — Progress Notes (Signed)
 Subjective:  Patient ID: Victoria Barajas, female    DOB: 1946/08/20  Age: 78 y.o. MRN: [id number]  Chief Complaint  Patient presents with   Medical Management of Chronic Issues    HPI: GERD: Prilosec 40 mg daily. Hiatal hernia.   HTN: Lisinopril  20 mg daily.   Diabetes: Last A1C 6.2% She is not taking any medication. She is controlling with diet.   Morbid obesity: eating healthy.    Allergic rhinitis: on flonase  and claritin .    Going to PT for back pain. Pain 2/10. Improving. Able to function better.      02/21/2024   10:11 AM 08/08/2023    9:29 AM 04/24/2023    3:56 PM 01/29/2023   10:51 AM 12/19/2021   10:15 AM  Depression screen PHQ 2/9  Decreased Interest 0 0 0 0 0  Down, Depressed, Hopeless 0 0 0 0 0  PHQ - 2 Score 0 0 0 0 0  Altered sleeping 2 0 1    Tired, decreased energy 2 0 3    Change in appetite 2 0 0    Feeling bad or failure about yourself  0 0 0    Trouble concentrating 0 0 0    Moving slowly or fidgety/restless 2 0 0    Suicidal thoughts 0 0 0    PHQ-9 Score 8 0 4    Difficult doing work/chores Not difficult at all Not difficult at all Not difficult at all          08/08/2023    9:06 AM  Fall Risk   Falls in the past year? 1  Number falls in past yr: 0  Injury with Fall? 1  Follow up Falls evaluation completed;Education provided;Falls prevention discussed    Patient Care Team: Mercy Stall, MD as PCP - General (Family Medicine) Revankar, Micael Adas, MD as PCP - Cardiology (Cardiology) Steven Elam, [facility] (Inactive) as Pharmacist (Pharmacist) Revankar, Micael Adas, MD as Consulting Physician (Cardiology)   Review of Systems  Constitutional:  Negative for chills, fatigue and fever.  HENT:  Negative for congestion, ear pain, rhinorrhea and sore throat.   Respiratory:  Negative for cough and shortness of breath.   Cardiovascular:  Negative for chest pain.  Gastrointestinal:  Positive for diarrhea and nausea (occasional.). Negative for abdominal pain,  constipation and vomiting.  Genitourinary:  Negative for dysuria and urgency.  Musculoskeletal:  Positive for back pain. Negative for myalgias.  Neurological:  Negative for dizziness, weakness, light-headedness and headaches.  Psychiatric/Behavioral:  Negative for dysphoric mood. The patient is not nervous/anxious.     Current Outpatient Medications on File Prior to Visit  Medication Sig Dispense Refill   aspirin EC 81 MG tablet Take 162 mg by mouth daily. Swallow whole.     aspirin-acetaminophen-caffeine (EXCEDRIN MIGRAINE) 250-250-65 MG tablet Take 1 tablet by mouth every 6 (six) hours as needed for migraine.     fluticasone  (FLONASE ) 50 MCG/ACT nasal spray Place 2 sprays into both nostrils daily. 16 g 6   lisinopril  (ZESTRIL ) 20 MG tablet TAKE 1 TABLET BY MOUTH EVERY DAY 90 tablet 0   loratadine  (CLARITIN ) 10 MG tablet Take 1 tablet (10 mg total) by mouth daily. 30 tablet 11   metoprolol  succinate (TOPROL  XL) 25 MG 24 hr tablet Take 1 tablet (25 mg total) by mouth daily. 90 tablet 3   naproxen  (NAPROSYN ) 500 MG tablet One twice daily with meals for 2 weeks, then twice daily as needed. 60 tablet 0  nitroGLYCERIN  (NITROSTAT ) 0.4 MG SL tablet Place 0.4 mg under the tongue every 5 (five) minutes as needed for chest pain.     omeprazole (PRILOSEC) 40 MG capsule TAKE 1 CAPSULE BY MOUTH EVERY NIGHT AT BEDTIME 90 capsule 2   polyethylene glycol (MIRALAX / GLYCOLAX) packet Take 17 g by mouth daily as needed for diarrhea or loose stools.     rosuvastatin  (CRESTOR ) 20 MG tablet TAKE 1 TABLET BY MOUTH EVERY DAY FOR CHOLESTEROL 30 tablet 0   No current facility-administered medications on file prior to visit.   Past Medical History:  Diagnosis Date   Age-related nuclear cataract of both eyes 01/06/2016   Aortic atherosclerosis (HCC)    2024   BMI 36.0-36.9,adult 03/11/2020   Dyspnea on exertion 12/22/2021   Essential hypertension 11/25/2017   Gastroesophageal reflux disease without esophagitis  01/06/2016   High cholesterol    History of pancreatitis    History of shingles    Idiopathic acute pancreatitis without infection or necrosis 04/24/2023   LVH (left ventricular hypertrophy) 03/11/2020   Migraine headache 01/06/2016   Mixed hyperlipidemia 01/06/2016   Morbid obesity (HCC) 09/09/2020   Myopia of both eyes 01/06/2016   Osteoporosis 01/06/2016   Pancreatitis, unspecified pancreatitis type 04/14/2023   Plantar fasciitis, left 01/06/2016   Presbyopia of both eyes 01/06/2016   Rectocele 05/23/2016   Rupture of flexor tendon of finger 10/28/2020   Spondylosis with myelopathy, lumbar region 11/12/2019   Urgency incontinence 06/13/2021   Past Surgical History:  Procedure Laterality Date   ABDOMINAL HYSTERECTOMY     CHOLECYSTECTOMY  04/08/2023   TUBAL LIGATION     WRIST SURGERY      Family History  Problem Relation Age of Onset   Heart Problems Mother    Diabetes Father    Diabetes Sister    Kidney disease Sister    Diabetes Brother    Alzheimer's disease Brother    Heart attack Brother    Social History   Socioeconomic History   Marital status: Married    Spouse name: Not on file   Number of children: Not on file   Years of education: Not on file   Highest education level: Not on file  Occupational History   Not on file  Tobacco Use   Smoking status: Former    Current packs/day: 0.00    Types: Cigarettes    Quit date: 1993    Years since quitting: 32.4   Smokeless tobacco: Never  Substance and Sexual Activity   Alcohol use: No   Drug use: No   Sexual activity: Not Currently  Other Topics Concern   Not on file  Social History Narrative   Not on file   Social Drivers of Health   Financial Resource Strain: Low Risk  (08/08/2023)   Overall Financial Resource Strain (CARDIA)    Difficulty of Paying Living Expenses: Not hard at all  Food Insecurity: No Food Insecurity (08/08/2023)   Hunger Vital Sign    Worried About Radiation protection practitioner of Food in the  Last Year: Never true    Ran Out of Food in the Last Year: Never true  Transportation Needs: No Transportation Needs (08/08/2023)   PRAPARE - Administrator, Civil Service (Medical): No    Lack of Transportation (Non-Medical): No  Physical Activity: Inactive (08/08/2023)   Exercise Vital Sign    Days of Exercise per Week: 0 days    Minutes of Exercise per Session: 0 min  Stress: No  Stress Concern Present (08/08/2023)   [employer] of Occupational Health - Occupational Stress Questionnaire    Feeling of Stress : Not at all  Social Connections: Moderately Isolated (08/08/2023)   Social Connection and Isolation Panel [NHANES]    Frequency of Communication with Friends and Family: Three times a week    Frequency of Social Gatherings with Friends and Family: Never    Attends Religious Services: Never    Diplomatic Services operational officer: No    Attends Engineer, structural: Never    Marital Status: Married    Objective:  BP 130/80   Pulse 68   Temp 97.8 F (36.6 C)   Ht 5\' 6"  (1.676 m)   Wt 209 lb (94.8 kg)   SpO2 97%   BMI 33.73 kg/m      02/21/2024   10:08 AM 11/21/2023   10:24 AM 10/16/2023    8:31 AM  BP/Weight  Systolic BP 130 136   Diastolic BP 80 68   Wt. (Lbs) 209 212 208  BMI 33.73 kg/m2 34.22 kg/m2 33.57 kg/m2    Physical Exam Vitals reviewed.  Constitutional:      Appearance: Normal appearance.  Neck:     Vascular: No carotid bruit.  Cardiovascular:     Rate and Rhythm: Normal rate and regular rhythm.     Heart sounds: Normal heart sounds.  Pulmonary:     Effort: Pulmonary effort is normal. No respiratory distress.     Breath sounds: Normal breath sounds.  Abdominal:     General: Abdomen is flat. Bowel sounds are normal.     Palpations: Abdomen is soft.     Tenderness: There is no abdominal tenderness.  Neurological:     Mental Status: She is alert and oriented to person, place, and time.  Psychiatric:        Mood and  Affect: Mood normal.        Behavior: Behavior normal.     Diabetic Foot Exam - Simple   Simple Foot Form Diabetic Foot exam was performed with the following findings: Yes 02/21/2024 10:57 AM  Visual Inspection No deformities, no ulcerations, no other skin breakdown bilaterally: Yes Sensation Testing Intact to touch and monofilament testing bilaterally: Yes Pulse Check Posterior Tibialis and Dorsalis pulse intact bilaterally: Yes Comments      Lab Results  Component Value Date   WBC 5.4 11/21/2023   HGB 13.8 11/21/2023   HCT 43.1 11/21/2023   PLT 227 11/21/2023   GLUCOSE 93 11/21/2023   CHOL 145 11/21/2023   TRIG 103 11/21/2023   HDL 54 11/21/2023   LDLCALC 72 11/21/2023   ALT 13 11/21/2023   AST 18 11/21/2023   NA 144 11/21/2023   K 5.1 11/21/2023   CL 105 11/21/2023   CREATININE 0.97 11/21/2023   BUN 16 11/21/2023   CO2 25 11/21/2023   TSH 1.940 04/24/2023   INR 0.95 11/08/2017   HGBA1C 6.2 (H) 11/21/2023      Assessment & Plan:  Essential hypertension Assessment & Plan: On Lisinopril  20mg  daily. No recent changes in medication. -Continue current medications. -Check cholesterol levels.  Orders: -     CBC with Differential/Platelet; Future -     Comprehensive metabolic panel with GFR; Future  Chronic systolic congestive heart failure (HCC) Assessment & Plan: On lisinopril .   Diabetes mellitus without complication (HCC) Assessment & Plan: -Continue dietary modifications. -Check A1c every 3 months. -Annual eye exam. -Check urine for protein annually.  Orders: -  Hemoglobin A1c; Future  Mixed hyperlipidemia Assessment & Plan: Continue Crestor .  Check lipid panel in 3 months.   Orders: -     Lipid panel; Future  Simple chronic bronchitis (HCC) Assessment & Plan: No medications needed.    Class 1 obesity due to excess calories with serious comorbidity and body mass index (BMI) of 33.0 to 33.9 in adult Assessment & Plan: Recommend  continue to work on eating healthy diet and exercise.       No orders of the defined types were placed in this encounter.   Orders Placed This Encounter  Procedures   CBC with Differential/Platelet   Comprehensive metabolic panel with GFR   Hemoglobin A1c   Lipid panel     Follow-up: Return in about 4 months (around 06/23/2024) for chronic follow up.   I,Marla I Leal-Borjas,acting as a scribe for Mercy Stall, MD.,have documented all relevant documentation on the behalf of Mercy Stall, MD,as directed by  Mercy Stall, MD while in the presence of Mercy Stall, MD.   An After Visit Summary was printed and given to the patient.  I attest that I have reviewed this visit and agree with the plan scribed by my staff.   Mercy Stall, MD [facility] [phone]

## 2024-02-21 ENCOUNTER — Encounter: Payer: Self-pay | Admitting: Family Medicine

## 2024-02-21 ENCOUNTER — Ambulatory Visit (INDEPENDENT_AMBULATORY_CARE_PROVIDER_SITE_OTHER): Payer: Medicare Other | Admitting: Family Medicine

## 2024-02-21 VITALS — BP 130/80 | HR 68 | Temp 97.8°F | Ht 66.0 in | Wt 209.0 lb

## 2024-02-21 DIAGNOSIS — J410 Simple chronic bronchitis: Secondary | ICD-10-CM

## 2024-02-21 DIAGNOSIS — I5022 Chronic systolic (congestive) heart failure: Secondary | ICD-10-CM | POA: Diagnosis not present

## 2024-02-21 DIAGNOSIS — E782 Mixed hyperlipidemia: Secondary | ICD-10-CM | POA: Diagnosis not present

## 2024-02-21 DIAGNOSIS — E119 Type 2 diabetes mellitus without complications: Secondary | ICD-10-CM

## 2024-02-21 DIAGNOSIS — E6609 Other obesity due to excess calories: Secondary | ICD-10-CM

## 2024-02-21 DIAGNOSIS — E66811 Obesity, class 1: Secondary | ICD-10-CM

## 2024-02-21 DIAGNOSIS — I10 Essential (primary) hypertension: Secondary | ICD-10-CM

## 2024-02-21 DIAGNOSIS — Z6833 Body mass index (BMI) 33.0-33.9, adult: Secondary | ICD-10-CM

## 2024-02-21 DIAGNOSIS — M810 Age-related osteoporosis without current pathological fracture: Secondary | ICD-10-CM

## 2024-02-23 NOTE — Assessment & Plan Note (Signed)
 Continue Crestor.  Check lipid panel in 3 months.

## 2024-02-23 NOTE — Assessment & Plan Note (Signed)
-  Continue dietary modifications. -Check A1c every 3 months. -Annual eye exam. -Check urine for protein annually.

## 2024-02-23 NOTE — Assessment & Plan Note (Signed)
On lisinopril

## 2024-02-23 NOTE — Assessment & Plan Note (Signed)
 On Lisinopril  20mg  daily. No recent changes in medication. -Continue current medications. -Check cholesterol levels.

## 2024-02-24 ENCOUNTER — Other Ambulatory Visit

## 2024-02-24 DIAGNOSIS — E782 Mixed hyperlipidemia: Secondary | ICD-10-CM | POA: Diagnosis not present

## 2024-02-24 DIAGNOSIS — I10 Essential (primary) hypertension: Secondary | ICD-10-CM | POA: Diagnosis not present

## 2024-02-24 DIAGNOSIS — E119 Type 2 diabetes mellitus without complications: Secondary | ICD-10-CM | POA: Diagnosis not present

## 2024-02-24 DIAGNOSIS — J410 Simple chronic bronchitis: Secondary | ICD-10-CM | POA: Insufficient documentation

## 2024-02-24 NOTE — Assessment & Plan Note (Signed)
No medications needed

## 2024-02-24 NOTE — Assessment & Plan Note (Signed)
 Recommend continue to work on eating healthy diet and exercise.

## 2024-02-25 ENCOUNTER — Ambulatory Visit: Payer: Self-pay | Admitting: Family Medicine

## 2024-02-25 LAB — COMPREHENSIVE METABOLIC PANEL WITH GFR
ALT: 17 IU/L (ref 0–32)
AST: 19 IU/L (ref 0–40)
Albumin: 4.6 g/dL (ref 3.8–4.8)
Alkaline Phosphatase: 80 IU/L (ref 44–121)
BUN/Creatinine Ratio: 16 (ref 12–28)
BUN: 15 mg/dL (ref 8–27)
Bilirubin Total: 1.1 mg/dL (ref 0.0–1.2)
CO2: 20 mmol/L (ref 20–29)
Calcium: 10.1 mg/dL (ref 8.7–10.3)
Chloride: 106 mmol/L (ref 96–106)
Creatinine, Ser: 0.91 mg/dL (ref 0.57–1.00)
Globulin, Total: 1.6 g/dL (ref 1.5–4.5)
Glucose: 94 mg/dL (ref 70–99)
Potassium: 5.3 mmol/L — ABNORMAL HIGH (ref 3.5–5.2)
Sodium: 142 mmol/L (ref 134–144)
Total Protein: 6.2 g/dL (ref 6.0–8.5)
eGFR: 65 mL/min/{1.73_m2} (ref >59)

## 2024-02-25 LAB — CBC WITH DIFFERENTIAL/PLATELET
Basophils Absolute: 0 10*3/uL (ref 0.0–0.2)
Basos: 1 %
EOS (ABSOLUTE): 0.1 10*3/uL (ref 0.0–0.4)
Eos: 2 %
Hematocrit: 45.8 % (ref 34.0–46.6)
Hemoglobin: 14.7 g/dL (ref 11.1–15.9)
Immature Grans (Abs): 0 10*3/uL (ref 0.0–0.1)
Immature Granulocytes: 0 %
Lymphocytes Absolute: 1.3 10*3/uL (ref 0.7–3.1)
Lymphs: 22 %
MCH: 29.5 pg (ref 26.6–33.0)
MCHC: 32.1 g/dL (ref 31.5–35.7)
MCV: 92 fL (ref 79–97)
Monocytes Absolute: 0.5 10*3/uL (ref 0.1–0.9)
Monocytes: 9 %
Neutrophils Absolute: 3.9 10*3/uL (ref 1.4–7.0)
Neutrophils: 66 %
Platelets: 228 10*3/uL (ref 150–450)
RBC: 4.98 x10E6/uL (ref 3.77–5.28)
RDW: 12.8 % (ref 11.7–15.4)
WBC: 5.9 10*3/uL (ref 3.4–10.8)

## 2024-02-25 LAB — LIPID PANEL
Chol/HDL Ratio: 4.9 ratio — ABNORMAL HIGH (ref 0.0–4.4)
Cholesterol, Total: 220 mg/dL — ABNORMAL HIGH (ref 100–199)
HDL: 45 mg/dL (ref >39)
LDL Chol Calc (NIH): 144 mg/dL — ABNORMAL HIGH (ref 0–99)
Triglycerides: 170 mg/dL — ABNORMAL HIGH (ref 0–149)
VLDL Cholesterol Cal: 31 mg/dL (ref 5–40)

## 2024-02-25 LAB — HEMOGLOBIN A1C
Est. average glucose Bld gHb Est-mCnc: 128 mg/dL
Hgb A1c MFr Bld: 6.1 % — ABNORMAL HIGH (ref 4.8–5.6)

## 2024-02-26 DIAGNOSIS — M5431 Sciatica, right side: Secondary | ICD-10-CM | POA: Diagnosis not present

## 2024-03-17 NOTE — Progress Notes (Signed)
   03/17/2024  Patient ID: Victoria Barajas, female   DOB: [birth year], 78 y.o.   MRN: [id number]  Pharmacy Quality Measure Review  This patient is appearing on a report for being at risk of failing the adherence measure for cholesterol (statin) medications this calendar year.   Medication: rosuvastatin  Last fill date: 12/23/23 for 30 day supply  Failed quality report d/t months' long hold of crestor  because of patients concern of potential liver harm, plan is to resume moving forward.   Rolando Cliche, PharmD, BCGP Clinical Pharmacist  [phone]

## 2024-03-19 ENCOUNTER — Other Ambulatory Visit: Payer: Self-pay | Admitting: Family Medicine

## 2024-03-19 DIAGNOSIS — E782 Mixed hyperlipidemia: Secondary | ICD-10-CM

## 2024-05-20 ENCOUNTER — Other Ambulatory Visit: Payer: Self-pay | Admitting: Cardiology

## 2024-05-28 DIAGNOSIS — K2270 Barrett's esophagus without dysplasia: Secondary | ICD-10-CM | POA: Diagnosis not present

## 2024-06-03 DIAGNOSIS — K2270 Barrett's esophagus without dysplasia: Secondary | ICD-10-CM | POA: Diagnosis not present

## 2024-06-03 DIAGNOSIS — Z1211 Encounter for screening for malignant neoplasm of colon: Secondary | ICD-10-CM | POA: Diagnosis not present

## 2024-06-23 ENCOUNTER — Ambulatory Visit: Admitting: Family Medicine

## 2024-06-25 ENCOUNTER — Encounter: Payer: Self-pay | Admitting: Family Medicine

## 2024-06-25 ENCOUNTER — Ambulatory Visit (INDEPENDENT_AMBULATORY_CARE_PROVIDER_SITE_OTHER): Admitting: Family Medicine

## 2024-06-25 ENCOUNTER — Ambulatory Visit: Payer: Self-pay | Admitting: Family Medicine

## 2024-06-25 VITALS — BP 128/82 | HR 60 | Temp 97.8°F | Ht 66.0 in | Wt 211.0 lb

## 2024-06-25 DIAGNOSIS — I10 Essential (primary) hypertension: Secondary | ICD-10-CM | POA: Diagnosis not present

## 2024-06-25 DIAGNOSIS — E782 Mixed hyperlipidemia: Secondary | ICD-10-CM

## 2024-06-25 DIAGNOSIS — E119 Type 2 diabetes mellitus without complications: Secondary | ICD-10-CM

## 2024-06-25 DIAGNOSIS — K219 Gastro-esophageal reflux disease without esophagitis: Secondary | ICD-10-CM

## 2024-06-25 DIAGNOSIS — J029 Acute pharyngitis, unspecified: Secondary | ICD-10-CM | POA: Diagnosis not present

## 2024-06-25 DIAGNOSIS — G43C0 Periodic headache syndromes in child or adult, not intractable: Secondary | ICD-10-CM

## 2024-06-25 DIAGNOSIS — M4716 Other spondylosis with myelopathy, lumbar region: Secondary | ICD-10-CM

## 2024-06-25 DIAGNOSIS — E66811 Obesity, class 1: Secondary | ICD-10-CM

## 2024-06-25 DIAGNOSIS — E6609 Other obesity due to excess calories: Secondary | ICD-10-CM

## 2024-06-25 DIAGNOSIS — Z6834 Body mass index (BMI) 34.0-34.9, adult: Secondary | ICD-10-CM

## 2024-06-25 LAB — POCT GLYCOSYLATED HEMOGLOBIN (HGB A1C): HbA1c POC (<> result, manual entry): 5.9 % (ref 4.0–5.6)

## 2024-06-25 LAB — POCT LIPID PANEL
HDL: 53
LDL: 154
Non-HDL: 178
TC: 231
TRG: 121

## 2024-06-25 MED ORDER — LORATADINE 10 MG PO TABS
10.0000 mg | ORAL_TABLET | Freq: Every day | ORAL | 11 refills | Status: AC
Start: 2024-06-25 — End: ?

## 2024-06-25 MED ORDER — FLUTICASONE PROPIONATE 50 MCG/ACT NA SUSP: 2.0000 | Freq: Every day | NASAL | 11 refills | Status: AC

## 2024-06-25 MED ORDER — ROSUVASTATIN CALCIUM 10 MG PO TABS: 10.0000 mg | ORAL_TABLET | Freq: Every day | ORAL | 1 refills | Status: DC

## 2024-06-25 NOTE — Patient Instructions (Addendum)
  VISIT SUMMARY: You visited us  today for worsening nasal congestion and drainage, chronic low back pain, and concerns about your cholesterol levels. We also reviewed your management plans for hypertension, diabetes, and GERD.  YOUR PLAN: CHRONIC ALLERGIC RHINITIS WITH POSTNASAL DRAINAGE: You have been experiencing significant nasal congestion and drainage for more than two weeks, especially at night. -Continue using loratadine  and Flonase  as needed for symptom relief.  CHRONIC LOW BACK PAIN: Your chronic low back pain persists despite previous physical therapy. -Continue using Aleve  and Excedrin for pain management as needed.  MIXED HYPERLIPIDEMIA: Your LDL cholesterol is elevated at 154 mg/dL. You previously stopped taking Crestor  due to concerns about liver damage, but your liver function tests have been normal. -Restart Crestor  at a lower dose of 10 mg daily. -We will monitor your liver function with blood tests. -Order blood count and liver and kidney function tests.  TYPE 2 DIABETES MELLITUS WITHOUT COMPLICATIONS: Your diabetes is well-controlled with an A1c of 5.9.  HYPERTENSION: Your blood pressure is managed with lisinopril  and metoprolol .  GASTROESOPHAGEAL REFLUX DISEASE (GERD): Your acid reflux is managed with omeprazole 40 mg daily.  MIGRAINE, IMPROVED: Your migraines have significantly improved with your current treatment. -Continue using Aleve  and Excedrin Migraine as needed for migraine management.                      Contains text generated by [employer]bridge.                                 Contains text generated by [employer]bridge.

## 2024-06-25 NOTE — Progress Notes (Signed)
 "  Subjective:  Patient ID: Victoria Barajas, female    DOB: Jul 13, 1946  Age: 78 y.o. MRN: [id number]  Chief Complaint  Patient presents with   Medical Management of Chronic Issues    HPI: Discussed the use of AI scribe software for clinical note transcription with the patient, who gave verbal consent to proceed.  History of Present Illness Victoria Barajas is a 78 year old female who presents with worsening nasal congestion and drainage.  Nasal congestion and rhinorrhea - Nasal congestion and drainage present for over two weeks - Symptoms have worsened since the start of the fall allergy season - Excessive mucus production, particularly at night - No sinus pressure, sore throat, fevers, or chills - Nocturnal symptoms cause her to sit up due to gagging and choking - Occasionally gargles with salt water to prevent sore throat - Uses loratadine  and Flonase  sparingly to avoid excessive dryness  Low back pain - Chronic low back pain - Completed eleven weeks of therapy without pain relief, but therapy improved her mindset - Continues to experience low back pain - Uses Aleve  and Excedrin together for pain relief, but not daily - Occasionally takes these medications for up to three consecutive days  Hyperlipidemia and statin intolerance - Previously on Crestor , discontinued due to concerns about liver damage from multiple medications - LDL cholesterol 154 at this visit, previously 144 - Has not taken cholesterol medication for several months  Cognitive concerns - Family history of dementia and Alzheimer's disease in four siblings - Concerned about developing dementia - No significant memory issues other than occasional forgetfulness about the location of items  Hypertension and cardiac medications - Takes lisinopril  20 mg once daily and metoprolol  XL 25 mg once daily for blood pressure control - Carries nitroglycerin , used three times since prescribed  Gastroesophageal reflux  disease - Takes omeprazole 40 mg once daily for acid reflux       06/25/2024    9:59 AM 02/21/2024   10:11 AM 08/08/2023    9:29 AM 04/24/2023    3:56 PM 01/29/2023   10:51 AM  Depression screen PHQ 2/9  Decreased Interest 0 0 0 0 0  Down, Depressed, Hopeless 0 0 0 0 0  PHQ - 2 Score 0 0 0 0 0  Altered sleeping 2 2 0 1   Tired, decreased energy 2 2 0 3   Change in appetite 2 2 0 0   Feeling bad or failure about yourself  0 0 0 0   Trouble concentrating 0 0 0 0   Moving slowly or fidgety/restless 0 2 0 0   Suicidal thoughts 0 0 0 0   PHQ-9 Score 6 8 0 4   Difficult doing work/chores Somewhat difficult Not difficult at all Not difficult at all Not difficult at all         06/25/2024    9:58 AM  Fall Risk   Falls in the past year? 0  Number falls in past yr: 0  Injury with Fall? 0  Risk for fall due to : No Fall Risks  Follow up Falls evaluation completed    Patient Care Team: Sherre Clapper, MD as PCP - General (Family Medicine) Revankar, Jennifer SAUNDERS, MD as PCP - Cardiology (Cardiology) Delores Lauraine NOVAK, [facility] (Inactive) as Pharmacist (Pharmacist) Revankar, Jennifer SAUNDERS, MD as Consulting Physician (Cardiology) Wilma Ozell BRAVO, MD as Referring Physician (Ophthalmology)   Review of Systems  Constitutional:  Negative for chills, fatigue and fever.  HENT:  Positive  for congestion and postnasal drip. Negative for ear pain and sore throat.   Respiratory:  Positive for cough. Negative for shortness of breath.   Cardiovascular:  Negative for chest pain.  Gastrointestinal:  Positive for abdominal pain (gassy). Negative for constipation, diarrhea, nausea and vomiting.  Endocrine: Negative for polydipsia, polyphagia and polyuria.  Genitourinary:  Negative for dysuria and urgency.  Musculoskeletal:  Positive for back pain (lumbar. physical therapy improved, but did not resolve the pain.). Negative for arthralgias and myalgias.  Skin:  Negative for rash.  Neurological:  Negative for dizziness  and headaches.  Psychiatric/Behavioral:  Negative for dysphoric mood. The patient is not nervous/anxious.     Current Outpatient Medications on File Prior to Visit  Medication Sig Dispense Refill   aspirin EC 81 MG tablet Take 162 mg by mouth daily. Swallow whole.     aspirin-acetaminophen-caffeine (EXCEDRIN MIGRAINE) 250-250-65 MG tablet Take 1 tablet by mouth every 6 (six) hours as needed for migraine.     lisinopril  (ZESTRIL ) 20 MG tablet TAKE 1 TABLET BY MOUTH EVERY DAY 90 tablet 0   metoprolol  succinate (TOPROL  XL) 25 MG 24 hr tablet Take 1 tablet (25 mg total) by mouth daily. 90 tablet 3   naproxen  (NAPROSYN ) 500 MG tablet One twice daily with meals for 2 weeks, then twice daily as needed. 60 tablet 0   nitroGLYCERIN  (NITROSTAT ) 0.4 MG SL tablet Place 0.4 mg under the tongue every 5 (five) minutes as needed for chest pain.     omeprazole (PRILOSEC) 40 MG capsule TAKE 1 CAPSULE BY MOUTH EVERY NIGHT AT BEDTIME 90 capsule 2   polyethylene glycol (MIRALAX / GLYCOLAX) packet Take 17 g by mouth daily as needed for diarrhea or loose stools.     No current facility-administered medications on file prior to visit.   Past Medical History:  Diagnosis Date   Age-related nuclear cataract of both eyes 01/06/2016   Aortic atherosclerosis    2024   BMI 36.0-36.9,adult 03/11/2020   Dyspnea on exertion 12/22/2021   Essential hypertension 11/25/2017   Gastroesophageal reflux disease without esophagitis 01/06/2016   High cholesterol    History of pancreatitis    History of shingles    Idiopathic acute pancreatitis without infection or necrosis 04/24/2023   LVH (left ventricular hypertrophy) 03/11/2020   Migraine headache 01/06/2016   Mixed hyperlipidemia 01/06/2016   Morbid obesity (HCC) 09/09/2020   Myopia of both eyes 01/06/2016   Osteoporosis 01/06/2016   Pancreatitis, unspecified pancreatitis type 04/14/2023   Plantar fasciitis, left 01/06/2016   Presbyopia of both eyes 01/06/2016    Rectocele 05/23/2016   Rupture of flexor tendon of finger 10/28/2020   Spondylosis with myelopathy, lumbar region 11/12/2019   Urgency incontinence 06/13/2021   Past Surgical History:  Procedure Laterality Date   ABDOMINAL HYSTERECTOMY     CHOLECYSTECTOMY  04/08/2023   TUBAL LIGATION     WRIST SURGERY      Family History  Problem Relation Age of Onset   Heart Problems Mother    Diabetes Father    Diabetes Sister    Kidney disease Sister    Diabetes Brother    Alzheimer's disease Brother    Heart attack Brother    Social History   Socioeconomic History   Marital status: Married    Spouse name: Not on file   Number of children: Not on file   Years of education: Not on file   Highest education level: Not on file  Occupational History   Not  on file  Tobacco Use   Smoking status: Former    Current packs/day: 0.00    Types: Cigarettes    Quit date: 1993    Years since quitting: 32.7   Smokeless tobacco: Never  Substance and Sexual Activity   Alcohol use: No   Drug use: No   Sexual activity: Not Currently  Other Topics Concern   Not on file  Social History Narrative   Not on file   Social Drivers of Health   Financial Resource Strain: Low Risk  (08/08/2023)   Overall Financial Resource Strain (CARDIA)    Difficulty of Paying Living Expenses: Not hard at all  Food Insecurity: No Food Insecurity (06/25/2024)   Hunger Vital Sign    Worried About Running Out of Food in the Last Year: Never true    Ran Out of Food in the Last Year: Never true  Transportation Needs: No Transportation Needs (06/25/2024)   PRAPARE - Administrator, Civil Service (Medical): No    Lack of Transportation (Non-Medical): No  Physical Activity: Inactive (08/08/2023)   Exercise Vital Sign    Days of Exercise per Week: 0 days    Minutes of Exercise per Session: 0 min  Stress: No Stress Concern Present (08/08/2023)   [employer] of Occupational Health - Occupational Stress  Questionnaire    Feeling of Stress : Not at all  Social Connections: Moderately Isolated (08/08/2023)   Social Connection and Isolation Panel    Frequency of Communication with Friends and Family: Three times a week    Frequency of Social Gatherings with Friends and Family: Never    Attends Religious Services: Never    Diplomatic Services Operational Officer: No    Attends Engineer, Structural: Never    Marital Status: Married    Objective:  BP 128/82   Pulse 60   Temp 97.8 F (36.6 C)   Ht 5' 6 (1.676 m)   Wt 211 lb (95.7 kg)   SpO2 98%   BMI 34.06 kg/m      06/25/2024    9:57 AM 02/21/2024   10:08 AM 11/21/2023   10:24 AM  BP/Weight  Systolic BP 128 130 136  Diastolic BP 82 80 68  Wt. (Lbs) 211 209 212  BMI 34.06 kg/m2 33.73 kg/m2 34.22 kg/m2    Physical Exam Vitals reviewed.  Constitutional:      Appearance: Normal appearance.  HENT:     Right Ear: Tympanic membrane, ear canal and external ear normal.     Left Ear: Tympanic membrane, ear canal and external ear normal.     Nose: Congestion and rhinorrhea present.     Mouth/Throat:     Pharynx: Oropharynx is clear. Posterior oropharyngeal erythema present.  Cardiovascular:     Rate and Rhythm: Normal rate and regular rhythm.     Heart sounds: Normal heart sounds. No murmur heard. Pulmonary:     Effort: Pulmonary effort is normal. No respiratory distress.     Breath sounds: Normal breath sounds.  Abdominal:     General: Bowel sounds are normal.     Palpations: Abdomen is soft.     Tenderness: There is no abdominal tenderness.  Lymphadenopathy:     Cervical: No cervical adenopathy.  Neurological:     Mental Status: She is alert and oriented to person, place, and time.  Psychiatric:        Mood and Affect: Mood normal.        Behavior:  Behavior normal.         Lab Results  Component Value Date   WBC 6.2 06/25/2024   HGB 13.8 06/25/2024   HCT 42.7 06/25/2024   PLT 233 06/25/2024   GLUCOSE  95 06/25/2024   CHOL 220 (H) 02/24/2024   TRIG 170 (H) 02/24/2024   HDL 45 02/24/2024   LDLCALC 144 (H) 02/24/2024   ALT 15 06/25/2024   AST 17 06/25/2024   NA 141 06/25/2024   K 5.2 06/25/2024   CL 104 06/25/2024   CREATININE 1.07 (H) 06/25/2024   BUN 16 06/25/2024   CO2 22 06/25/2024   TSH 1.940 04/24/2023   INR 0.95 11/08/2017   HGBA1C 5.9 06/25/2024            Component Ref Range & Units (hover) 10:16 (06/25/24) 4 mo ago (02/24/24) 7 mo ago (11/21/23) 1 yr ago (05/21/23) 1 yr ago (01/29/23) 1 yr ago (07/04/22) 2 yr ago (12/19/21)  TC 231        HDL 53 45 R 54 R 48 R 44 R 50 R 50 R  TRG 121        LDL 154        Non-HDL 178        TC/HDL               Assessment & Plan:  Pharyngitis, unspecified etiology Assessment & Plan: - Continue loratadine  and Flonase  as needed for symptom relief.  Orders: -     Loratadine ; Take 1 tablet (10 mg total) by mouth daily.  Dispense: 30 tablet; Refill: 11 -     Fluticasone  Propionate; Place 2 sprays into both nostrils daily.  Dispense: 16 g; Refill: 11  Spondylosis with myelopathy, lumbar region Assessment & Plan: - Continue Aleve  and Excedrin for pain management as needed.   Mixed hyperlipidemia Assessment & Plan: - Restart Crestor  at a lower dose of 10 mg daily. - Order blood count and liver and kidney function tests.  Orders: -     POCT Lipid Panel -     Rosuvastatin  Calcium ; Take 1 tablet (10 mg total) by mouth daily.  Dispense: 90 tablet; Refill: 1 -     CBC with Differential/Platelet -     Comprehensive metabolic panel with GFR  Diabetes mellitus without complication (HCC) -     POCT glycosylated hemoglobin (Hb A1C)  Essential hypertension Assessment & Plan: At goal. Continue lisinopril  20 mg once daily and metoprolol  XL 25 mg once daily for blood pressure control   Gastroesophageal reflux disease without esophagitis Assessment & Plan: Managed with Omeprazole 40mg  daily. Patient reports improvement in  symptoms. -Continue Omeprazole 40mg  daily.   Periodic headache syndrome, not intractable Assessment & Plan: Migraines significantly improved. Current management with Aleve  and Excedrin Migraine effectively controls symptoms. - Continue Aleve  and Excedrin Migraine as needed for migraine management.   Class 1 obesity due to excess calories with serious comorbidity and body mass index (BMI) of 34.0 to 34.9 in adult Assessment & Plan: Comorbidity: hypertension Recommend continue to work on eating healthy diet and exercise.        Body mass index is 34.06 kg/m.  :  Meds ordered this encounter  Medications   loratadine  (CLARITIN ) 10 MG tablet    Sig: Take 1 tablet (10 mg total) by mouth daily.    Dispense:  30 tablet    Refill:  11   fluticasone  (FLONASE ) 50 MCG/ACT nasal spray    Sig: Place 2 sprays into  both nostrils daily.    Dispense:  16 g    Refill:  11   rosuvastatin  (CRESTOR ) 10 MG tablet    Sig: Take 1 tablet (10 mg total) by mouth daily.    Dispense:  90 tablet    Refill:  1    Orders Placed This Encounter  Procedures   CBC with Differential/Platelet   Comprehensive metabolic panel with GFR   POCT glycosylated hemoglobin (Hb A1C)   POCT Lipid Panel       Follow-up: Return in about 3 months (around 09/24/2024) for chronic follow up.   I,Marla I Leal-Borjas,acting as a scribe for Abigail Free, MD.,have documented all relevant documentation on the behalf of Abigail Free, MD,as directed by  Abigail Free, MD while in the presence of Abigail Free, MD.    An After Visit Summary was printed and given to the patient.  I attest that I have reviewed this visit and agree with the plan scribed by my staff.   Abigail Free, MD [facility] [phone]    "

## 2024-06-26 LAB — COMPREHENSIVE METABOLIC PANEL WITH GFR
ALT: 15 IU/L (ref 0–32)
AST: 17 IU/L (ref 0–40)
Albumin: 4.5 g/dL (ref 3.8–4.8)
Alkaline Phosphatase: 75 IU/L (ref 49–135)
BUN/Creatinine Ratio: 15 (ref 12–28)
BUN: 16 mg/dL (ref 8–27)
Bilirubin Total: 1.3 mg/dL — ABNORMAL HIGH (ref 0.0–1.2)
CO2: 22 mmol/L (ref 20–29)
Calcium: 10 mg/dL (ref 8.7–10.3)
Chloride: 104 mmol/L (ref 96–106)
Creatinine, Ser: 1.07 mg/dL — ABNORMAL HIGH (ref 0.57–1.00)
Globulin, Total: 1.9 g/dL (ref 1.5–4.5)
Glucose: 95 mg/dL (ref 70–99)
Potassium: 5.2 mmol/L (ref 3.5–5.2)
Sodium: 141 mmol/L (ref 134–144)
Total Protein: 6.4 g/dL (ref 6.0–8.5)
eGFR: 54 mL/min/1.73 — ABNORMAL LOW (ref >59)

## 2024-06-26 LAB — CBC WITH DIFFERENTIAL/PLATELET
Basophils Absolute: 0 x10E3/uL (ref 0.0–0.2)
Basos: 1 %
EOS (ABSOLUTE): 0.1 x10E3/uL (ref 0.0–0.4)
Eos: 2 %
Hematocrit: 42.7 % (ref 34.0–46.6)
Hemoglobin: 13.8 g/dL (ref 11.1–15.9)
Immature Grans (Abs): 0 x10E3/uL (ref 0.0–0.1)
Immature Granulocytes: 0 %
Lymphocytes Absolute: 1.2 x10E3/uL (ref 0.7–3.1)
Lymphs: 19 %
MCH: 29.7 pg (ref 26.6–33.0)
MCHC: 32.3 g/dL (ref 31.5–35.7)
MCV: 92 fL (ref 79–97)
Monocytes Absolute: 0.5 x10E3/uL (ref 0.1–0.9)
Monocytes: 9 %
Neutrophils Absolute: 4.3 x10E3/uL (ref 1.4–7.0)
Neutrophils: 69 %
Platelets: 233 x10E3/uL (ref 150–450)
RBC: 4.65 x10E6/uL (ref 3.77–5.28)
RDW: 12.8 % (ref 11.7–15.4)
WBC: 6.2 x10E3/uL (ref 3.4–10.8)

## 2024-06-28 ENCOUNTER — Encounter: Payer: Self-pay | Admitting: Family Medicine

## 2024-06-28 DIAGNOSIS — J029 Acute pharyngitis, unspecified: Secondary | ICD-10-CM | POA: Insufficient documentation

## 2024-06-28 NOTE — Assessment & Plan Note (Signed)
-   Continue loratadine  and Flonase  as needed for symptom relief.

## 2024-06-28 NOTE — Assessment & Plan Note (Signed)
-   Continue Aleve  and Excedrin for pain management as needed.

## 2024-06-28 NOTE — Assessment & Plan Note (Signed)
-   Restart Crestor  at a lower dose of 10 mg daily. - Order blood count and liver and kidney function tests.

## 2024-06-29 NOTE — Telephone Encounter (Signed)
 Copied from CRM [id number]. Topic: Clinical - Lab/Test Results >> Jun 29, 2024 11:42 AM Ivette P wrote: Reason for CRM: Pt called in about lab results and read as follows.   Blood count normal.  Liver function normal.  Kidney function abnormal. Recommend against use of nonsteroid antiinflammatories, such as naproxen  (aleve ) or ibuprofen (advil.) these can worsen kidney dysfunction.  Recommend repeat kidney function in 2 weeks.   PT has understood. No further questions

## 2024-07-01 NOTE — Assessment & Plan Note (Signed)
 Managed with Omeprazole 40mg  daily. Patient reports improvement in symptoms. -Continue Omeprazole 40mg  daily.

## 2024-07-01 NOTE — Assessment & Plan Note (Signed)
 Migraines significantly improved. Current management with Aleve  and Excedrin Migraine effectively controls symptoms. - Continue Aleve  and Excedrin Migraine as needed for migraine management.

## 2024-07-01 NOTE — Assessment & Plan Note (Signed)
 At goal. Continue lisinopril  20 mg once daily and metoprolol  XL 25 mg once daily for blood pressure control

## 2024-07-01 NOTE — Assessment & Plan Note (Signed)
 Comorbidity: hypertension Recommend continue to work on eating healthy diet and exercise.

## 2024-07-08 ENCOUNTER — Other Ambulatory Visit: Payer: Self-pay | Admitting: Family Medicine

## 2024-07-08 DIAGNOSIS — I10 Essential (primary) hypertension: Secondary | ICD-10-CM

## 2024-07-13 ENCOUNTER — Other Ambulatory Visit: Payer: Self-pay

## 2024-07-13 ENCOUNTER — Other Ambulatory Visit

## 2024-07-13 DIAGNOSIS — R899 Unspecified abnormal finding in specimens from other organs, systems and tissues: Secondary | ICD-10-CM | POA: Diagnosis not present

## 2024-07-14 ENCOUNTER — Ambulatory Visit: Payer: Self-pay | Admitting: Family Medicine

## 2024-07-14 LAB — COMPREHENSIVE METABOLIC PANEL WITH GFR
ALT: 16 IU/L (ref 0–32)
AST: 20 IU/L (ref 0–40)
Albumin: 4.4 g/dL (ref 3.8–4.8)
Alkaline Phosphatase: 77 IU/L (ref 49–135)
BUN/Creatinine Ratio: 17 (ref 12–28)
BUN: 17 mg/dL (ref 8–27)
Bilirubin Total: 1.2 mg/dL (ref 0.0–1.2)
CO2: 16 mmol/L — ABNORMAL LOW (ref 20–29)
Calcium: 10.1 mg/dL (ref 8.7–10.3)
Chloride: 105 mmol/L (ref 96–106)
Creatinine, Ser: 1.01 mg/dL — ABNORMAL HIGH (ref 0.57–1.00)
Globulin, Total: 2.1 g/dL (ref 1.5–4.5)
Glucose: 99 mg/dL (ref 70–99)
Potassium: 5.1 mmol/L (ref 3.5–5.2)
Sodium: 142 mmol/L (ref 134–144)
Total Protein: 6.5 g/dL (ref 6.0–8.5)
eGFR: 58 mL/min/1.73 — ABNORMAL LOW (ref >59)

## 2024-07-20 ENCOUNTER — Ambulatory Visit: Payer: Self-pay | Admitting: Family Medicine

## 2024-07-20 ENCOUNTER — Ambulatory Visit: Admitting: Family Medicine

## 2024-07-20 ENCOUNTER — Encounter: Payer: Self-pay | Admitting: Family Medicine

## 2024-07-20 VITALS — BP 138/60 | HR 97 | Temp 98.0°F | Resp 18 | Ht 66.0 in | Wt 211.0 lb

## 2024-07-20 DIAGNOSIS — N3000 Acute cystitis without hematuria: Secondary | ICD-10-CM | POA: Diagnosis not present

## 2024-07-20 DIAGNOSIS — N816 Rectocele: Secondary | ICD-10-CM | POA: Diagnosis not present

## 2024-07-20 DIAGNOSIS — R300 Dysuria: Secondary | ICD-10-CM | POA: Diagnosis not present

## 2024-07-20 LAB — POCT URINALYSIS DIP (CLINITEK)
Bilirubin, UA: NEGATIVE
Blood, UA: NEGATIVE
Glucose, UA: NEGATIVE mg/dL
Ketones, POC UA: NEGATIVE mg/dL
Nitrite, UA: POSITIVE — AB
POC PROTEIN,UA: NEGATIVE
Spec Grav, UA: 1.01 (ref 1.010–1.025)
Urobilinogen, UA: 0.2 U/dL
pH, UA: 6 (ref 5.0–8.0)

## 2024-07-20 MED ORDER — NITROFURANTOIN MONOHYD MACRO 100 MG PO CAPS: 100.0000 mg | ORAL_CAPSULE | Freq: Two times a day (BID) | ORAL | 0 refills | Status: AC

## 2024-07-20 NOTE — Assessment & Plan Note (Signed)
 Suspected UTI with low abdominal pain and tenderness. - Obtain urine sample for urinalysis and culture. - Prescribe antibiotic pending culture results.  Orders:   POCT URINALYSIS DIP (CLINITEK)   Urine Culture

## 2024-07-20 NOTE — Assessment & Plan Note (Signed)
 Rectal prolapse with chronic constipation Previous rectal prolapse with contributing factors including chronic constipation, heavy lifting, and multiple pregnancies. Previous rectocele surgery in 2017 with complications. She is requesting gastroenterology consultation. - Refer to gastroenterologist in [facility] for evaluation and management. Orders:   Ambulatory referral to Gastroenterology

## 2024-07-20 NOTE — Telephone Encounter (Signed)
 FYI Only or Action Required?: FYI only for provider.  Patient was last seen in primary care on 06/25/2024 by Sherre Clapper, MD.  Called Nurse Triage reporting Abdominal Pain.  Symptoms began several days ago.  Triage Disposition: See HCP Within 4 Hours (Or PCP Triage)  Patient/caregiver understands and will follow disposition?: Yes           Copied from CRM [id number]. Topic: Clinical - Red Word Triage >> Jul 20, 2024  9:24 AM Antwanette L wrote: Red Word that prompted transfer to Nurse Triage: The patient reports abdominal pain persisting for the past three days following prolapse colon surgery. Due to ongoing discomfort, the patient is requesting a referral to a urogynecologist or colorectal surgeon for further evaluation Reason for Disposition  [1] MILD-MODERATE pain AND [2] constant AND [3] present > 2 hours  Answer Assessment - Initial Assessment Questions This RN scheduled pt for an appointment today in office. Pt is wanting a referral to a specialist after being seen if deemed necessary.   Lower abdominal pain for 3-4 days Last night 10/10 pain, this morning eased up some 6/10 pain level Bowels not emptying; pt is having bms; pt still feels full down there Pt states she can tell her colon surgery has prolapsed; pt states she looked with a mirror; pt states her surgeon has retired  RADIATION: Does the pain shoot anywhere else? (e.g., chest, back)     Denies  Denies vomiting, fever  Protocols used: Abdominal Pain - [facility]

## 2024-07-20 NOTE — Assessment & Plan Note (Signed)
 UTI Patient here with symptoms of dysuria started yesterday. UA in the office showed Lab Results  Component Value Date   COLORU yellow 07/20/2024   CLARITYU cloudy (A) 07/20/2024   GLUCOSEUR negative 07/20/2024   BILIRUBINUR negative 07/20/2024   SPECGRAV 1.010 07/20/2024   RBCUR negative 07/20/2024   PHUR 6.0 07/20/2024   UROBILINOGEN 0.2 07/20/2024   LEUKOCYTESUR Large (3+) (A) 07/20/2024    Suprapubic tenderness noted on exam.  She does not appear septic, has no flank tenderness.  Vitals reassuring.   Plan: Symptoms suggestive of urinary tract infection. I have prescribed Macrobid  100 mg by mouth TWICE A DAY for 7 days. Your symptoms should gradually improve. Call if the burning worsens, you develop a fever, back pain or pelvic pain or if your symptom do not resolve after completing the antibiotic. Drink plenty of fluids Complete the full course of antibiotics even if the symptoms resolve Remember to wipe from front to back and don't hold it in! If possible, empty your bladder every 4 hours. Advised to watch out for any fever/chills/back pain and to go to the emergency room if she does for evaluation for pyelonephritis and IV fluids and IV antibiotics.  Orders:   nitrofurantoin , macrocrystal-monohydrate, (MACROBID ) 100 MG capsule; Take 1 capsule (100 mg total) by mouth 2 (two) times daily for 7 days.

## 2024-07-20 NOTE — Progress Notes (Signed)
 Acute Office Visit  Subjective:    Patient ID: Victoria Barajas, female    DOB: March 13, 1946, 78 y.o.   MRN: [id number]  Chief Complaint  Patient presents with   Abdominal Pain   Dysuria    Discussed the use of AI scribe software for clinical note transcription with the patient, who gave verbal consent to proceed.  History of Present Illness   Victoria Barajas is a 78 year old female with a history of colon prolapse who presents with recurrent symptoms of prolapse and dysuria.  Colorectal prolapse symptoms - Recurrent sensation of pelvic pressure and protrusion of colon through the vagina, similar to previous episode. - Initially mistaken for bladder issue, but confirmed as colon prolapse by gynecologist - History of constipation - History of heavy lifting throughout life, believed to contribute to prolapse - Rectocele repair performed in August 2017, complicated by prolonged procedure and postoperative right leg weakness due to nerve compression from spinal curvature  Urinary tract symptoms - Concern for urinary tract infection - Low abdominal pain and tenderness - Suspicion of bacterial contamination of urine due to proximity of prolapse to vaginal area - Denies fever  Gastrointestinal symptoms and history - History of celiac disease with severe episodes of vomiting and diarrhea - Cholecystectomy performed - Pancreatitis episode last year, resolved without surgical intervention  Cardiac history - History of heart disease with myocardial thickening and two leaking heart valves  Renal function - Chronic decreased renal function with GFR consistently 58-59 since 2017 - No recent use of ibuprofen; uses Tylenol for back pain      Past Medical History:  Diagnosis Date   Age-related nuclear cataract of both eyes 01/06/2016   Aortic atherosclerosis    2024   BMI 36.0-36.9,adult 03/11/2020   Dyspnea on exertion 12/22/2021   Essential hypertension 11/25/2017   Gastroesophageal  reflux disease without esophagitis 01/06/2016   High cholesterol    History of pancreatitis    History of shingles    Idiopathic acute pancreatitis without infection or necrosis 04/24/2023   LVH (left ventricular hypertrophy) 03/11/2020   Migraine headache 01/06/2016   Mixed hyperlipidemia 01/06/2016   Morbid obesity (HCC) 09/09/2020   Myopia of both eyes 01/06/2016   Osteoporosis 01/06/2016   Pancreatitis, unspecified pancreatitis type 04/14/2023   Plantar fasciitis, left 01/06/2016   Presbyopia of both eyes 01/06/2016   Rectocele 05/23/2016   Rupture of flexor tendon of finger 10/28/2020   Spondylosis with myelopathy, lumbar region 11/12/2019   Urgency incontinence 06/13/2021    Past Surgical History:  Procedure Laterality Date   ABDOMINAL HYSTERECTOMY     CHOLECYSTECTOMY  04/08/2023   TUBAL LIGATION     WRIST SURGERY      Family History  Problem Relation Age of Onset   Heart Problems Mother    Diabetes Father    Diabetes Sister    Kidney disease Sister    Diabetes Brother    Alzheimer's disease Brother    Heart attack Brother     Social History   Socioeconomic History   Marital status: Married    Spouse name: Not on file   Number of children: Not on file   Years of education: Not on file   Highest education level: Not on file  Occupational History   Not on file  Tobacco Use   Smoking status: Former    Current packs/day: 0.00    Types: Cigarettes    Quit date: 1993    Years since quitting: 32.8  Smokeless tobacco: Never  Substance and Sexual Activity   Alcohol use: No   Drug use: No   Sexual activity: Not Currently  Other Topics Concern   Not on file  Social History Narrative   Not on file   Social Drivers of Health   Financial Resource Strain: Low Risk  (08/08/2023)   Overall Financial Resource Strain (CARDIA)    Difficulty of Paying Living Expenses: Not hard at all  Food Insecurity: No Food Insecurity (06/25/2024)   Hunger Vital Sign     Worried About Running Out of Food in the Last Year: Never true    Ran Out of Food in the Last Year: Never true  Transportation Needs: No Transportation Needs (06/25/2024)   PRAPARE - Administrator, Civil Service (Medical): No    Lack of Transportation (Non-Medical): No  Physical Activity: Inactive (08/08/2023)   Exercise Vital Sign    Days of Exercise per Week: 0 days    Minutes of Exercise per Session: 0 min  Stress: No Stress Concern Present (08/08/2023)   [employer] of Occupational Health - Occupational Stress Questionnaire    Feeling of Stress : Not at all  Social Connections: Moderately Isolated (08/08/2023)   Social Connection and Isolation Panel    Frequency of Communication with Friends and Family: Three times a week    Frequency of Social Gatherings with Friends and Family: Never    Attends Religious Services: Never    Database administrator or Organizations: No    Attends Banker Meetings: Never    Marital Status: Married  Catering manager Violence: Not At Risk (06/25/2024)   Humiliation, Afraid, Rape, and Kick questionnaire    Fear of Current or Ex-Partner: No    Emotionally Abused: No    Physically Abused: No    Sexually Abused: No    Outpatient Medications Prior to Visit  Medication Sig Dispense Refill   fluticasone  (FLONASE ) 50 MCG/ACT nasal spray Place 2 sprays into both nostrils daily. 16 g 11   lisinopril  (ZESTRIL ) 20 MG tablet TAKE 1 TABLET BY MOUTH EVERY DAY 90 tablet 0   loratadine  (CLARITIN ) 10 MG tablet Take 1 tablet (10 mg total) by mouth daily. 30 tablet 11   metoprolol  succinate (TOPROL  XL) 25 MG 24 hr tablet Take 1 tablet (25 mg total) by mouth daily. 90 tablet 3   nitroGLYCERIN  (NITROSTAT ) 0.4 MG SL tablet Place 0.4 mg under the tongue every 5 (five) minutes as needed for chest pain.     omeprazole (PRILOSEC) 40 MG capsule TAKE 1 CAPSULE BY MOUTH EVERY NIGHT AT BEDTIME 90 capsule 2   polyethylene glycol (MIRALAX / GLYCOLAX)  packet Take 17 g by mouth daily as needed for diarrhea or loose stools.     rosuvastatin  (CRESTOR ) 10 MG tablet Take 1 tablet (10 mg total) by mouth daily. 90 tablet 1   aspirin EC 81 MG tablet Take 162 mg by mouth daily. Swallow whole. (Patient not taking: Reported on 07/20/2024)     aspirin-acetaminophen-caffeine (EXCEDRIN MIGRAINE) 250-250-65 MG tablet Take 1 tablet by mouth every 6 (six) hours as needed for migraine. (Patient not taking: Reported on 07/20/2024)     naproxen  (NAPROSYN ) 500 MG tablet One twice daily with meals for 2 weeks, then twice daily as needed. (Patient not taking: Reported on 07/20/2024) 60 tablet 0   No facility-administered medications prior to visit.    Allergies  Allergen Reactions   Prolia [Denosumab] Other (See Comments)  Severe myalgia   Alendronate Other (See Comments)    Severe fatigue and aching   Tape Rash    SOME ADHESIVES LEAVE RASH, RED AREA    Review of Systems  Constitutional:  Negative for chills, diaphoresis, fatigue and fever.  HENT:  Negative for congestion, ear pain and sinus pain.   Eyes: Negative.   Respiratory:  Negative for cough and shortness of breath.   Cardiovascular:  Negative for chest pain.  Gastrointestinal:  Positive for abdominal pain, constipation, diarrhea, nausea and vomiting.  Endocrine: Negative.   Genitourinary:  Positive for dysuria, frequency and urgency. Negative for hematuria.  Musculoskeletal:  Negative for arthralgias.  Skin: Negative.   Allergic/Immunologic: Negative.   Neurological:  Negative for dizziness, weakness, light-headedness and headaches.  Hematological: Negative.   Psychiatric/Behavioral:  Negative for dysphoric mood. The patient is not nervous/anxious.        Objective:        07/20/2024    1:18 PM 06/25/2024    9:57 AM 02/21/2024   10:08 AM  Vitals with BMI  Height 5' 6 5' 6 5' 6  Weight 211 lbs 211 lbs 209 lbs  BMI 34.07 34.07 33.75  Systolic 138 128 869  Diastolic 60 82 80   Pulse 97 60 68    No data found.   Physical Exam Vitals reviewed.  Constitutional:      General: She is not in acute distress.    Appearance: Normal appearance. She is obese. She is not ill-appearing.  Eyes:     Conjunctiva/sclera: Conjunctivae normal.  Neck:     Vascular: No carotid bruit.  Cardiovascular:     Rate and Rhythm: Normal rate and regular rhythm.     Heart sounds: Normal heart sounds. No murmur heard. Pulmonary:     Effort: Pulmonary effort is normal.     Breath sounds: Normal breath sounds. No wheezing.  Abdominal:     General: Bowel sounds are normal.     Palpations: Abdomen is soft.     Tenderness: There is abdominal tenderness in the suprapubic area.  Skin:    General: Skin is warm.  Neurological:     Mental Status: She is alert and oriented to person, place, and time. Mental status is at baseline.  Psychiatric:        Mood and Affect: Mood normal.        Behavior: Behavior normal.     Lab Results  Component Value Date   TSH 1.940 04/24/2023   Lab Results  Component Value Date   WBC 6.2 06/25/2024   HGB 13.8 06/25/2024   HCT 42.7 06/25/2024   MCV 92 06/25/2024   PLT 233 06/25/2024   Lab Results  Component Value Date   NA 142 07/13/2024   K 5.1 07/13/2024   CO2 16 (L) 07/13/2024   GLUCOSE 99 07/13/2024   BUN 17 07/13/2024   CREATININE 1.01 (H) 07/13/2024   BILITOT 1.2 07/13/2024   ALKPHOS 77 07/13/2024   AST 20 07/13/2024   ALT 16 07/13/2024   PROT 6.5 07/13/2024   ALBUMIN 4.4 07/13/2024   CALCIUM  10.1 07/13/2024   ANIONGAP 14 11/08/2017   EGFR 58 (L) 07/13/2024   Lab Results  Component Value Date   CHOL 220 (H) 02/24/2024   Lab Results  Component Value Date   HDL 45 02/24/2024   Lab Results  Component Value Date   LDLCALC 144 (H) 02/24/2024   Lab Results  Component Value Date   TRIG 170 (H)  02/24/2024   Lab Results  Component Value Date   CHOLHDL 4.9 (H) 02/24/2024   Lab Results  Component Value Date   HGBA1C 5.9  06/25/2024        Results for orders placed or performed in visit on 07/20/24  POCT URINALYSIS DIP (CLINITEK)   Collection Time: 07/20/24  2:07 PM  Result Value Ref Range   Color, UA yellow yellow   Clarity, UA cloudy (A) clear   Glucose, UA negative negative mg/dL   Bilirubin, UA negative negative   Ketones, POC UA negative negative mg/dL   Spec Grav, UA 8.989 8.989 - 1.025   Blood, UA negative negative   pH, UA 6.0 5.0 - 8.0   POC PROTEIN,UA negative negative, trace   Urobilinogen, UA 0.2 0.2 or 1.0 E.U./dL   Nitrite, UA Positive (A) Negative   Leukocytes, UA Large (3+) (A) Negative     Assessment & Plan:   Assessment & Plan Dysuria Suspected UTI with low abdominal pain and tenderness. - Obtain urine sample for urinalysis and culture. - Prescribe antibiotic pending culture results.  Orders:   POCT URINALYSIS DIP (CLINITEK)   Urine Culture  Rectocele Rectal prolapse with chronic constipation Previous rectal prolapse with contributing factors including chronic constipation, heavy lifting, and multiple pregnancies. Previous rectocele surgery in 2017 with complications. She is requesting gastroenterology consultation. - Refer to gastroenterologist in [facility] for evaluation and management. Orders:   Ambulatory referral to Gastroenterology  Acute cystitis without hematuria UTI Patient here with symptoms of dysuria started yesterday. UA in the office showed Lab Results  Component Value Date   COLORU yellow 07/20/2024   CLARITYU cloudy (A) 07/20/2024   GLUCOSEUR negative 07/20/2024   BILIRUBINUR negative 07/20/2024   SPECGRAV 1.010 07/20/2024   RBCUR negative 07/20/2024   PHUR 6.0 07/20/2024   UROBILINOGEN 0.2 07/20/2024   LEUKOCYTESUR Large (3+) (A) 07/20/2024    Suprapubic tenderness noted on exam.  She does not appear septic, has no flank tenderness.  Vitals reassuring.   Plan: Symptoms suggestive of urinary tract infection. I have prescribed Macrobid  100  mg by mouth TWICE A DAY for 7 days. Your symptoms should gradually improve. Call if the burning worsens, you develop a fever, back pain or pelvic pain or if your symptom do not resolve after completing the antibiotic. Drink plenty of fluids Complete the full course of antibiotics even if the symptoms resolve Remember to wipe from front to back and don't hold it in! If possible, empty your bladder every 4 hours. Advised to watch out for any fever/chills/back pain and to go to the emergency room if she does for evaluation for pyelonephritis and IV fluids and IV antibiotics.  Orders:   nitrofurantoin , macrocrystal-monohydrate, (MACROBID ) 100 MG capsule; Take 1 capsule (100 mg total) by mouth 2 (two) times daily for 7 days.      Orders Placed This Encounter  Procedures   Urine Culture   Ambulatory referral to Gastroenterology   POCT URINALYSIS DIP (CLINITEK)     Follow-up: Return if symptoms worsen or fail to improve.  An After Visit Summary was printed and given to the patient.  Harrie Cedar, FNP [facility] [phone]

## 2024-07-24 LAB — URINE CULTURE

## 2024-07-26 ENCOUNTER — Ambulatory Visit: Payer: Self-pay | Admitting: Family Medicine

## 2024-08-19 ENCOUNTER — Other Ambulatory Visit: Payer: Self-pay | Admitting: Cardiology

## 2024-09-25 ENCOUNTER — Ambulatory Visit: Admitting: Family Medicine

## 2024-09-25 VITALS — BP 128/84 | HR 79 | Temp 98.2°F | Ht 66.0 in | Wt 210.0 lb

## 2024-09-25 DIAGNOSIS — E1169 Type 2 diabetes mellitus with other specified complication: Secondary | ICD-10-CM

## 2024-09-25 DIAGNOSIS — I480 Paroxysmal atrial fibrillation: Secondary | ICD-10-CM | POA: Diagnosis not present

## 2024-09-25 DIAGNOSIS — E119 Type 2 diabetes mellitus without complications: Secondary | ICD-10-CM

## 2024-09-25 DIAGNOSIS — M4716 Other spondylosis with myelopathy, lumbar region: Secondary | ICD-10-CM

## 2024-09-25 DIAGNOSIS — E785 Hyperlipidemia, unspecified: Secondary | ICD-10-CM | POA: Diagnosis not present

## 2024-09-25 DIAGNOSIS — I10 Essential (primary) hypertension: Secondary | ICD-10-CM

## 2024-09-25 LAB — POCT GLYCOSYLATED HEMOGLOBIN (HGB A1C): HbA1c POC (<> result, manual entry): 6.1 %

## 2024-09-25 LAB — POCT LIPID PANEL
HDL: 49
LDL: 90
Non-HDL: 114
TC: 163
TRG: 123

## 2024-09-25 MED ORDER — METOPROLOL SUCCINATE ER 25 MG PO TB24: 25.0000 mg | ORAL_TABLET | Freq: Every day | ORAL | 0 refills | Status: AC

## 2024-09-25 NOTE — Progress Notes (Signed)
 "  Subjective:  Patient ID: Victoria Barajas, female    DOB: [birth year]  Age: 78 y.o. MRN: [id number]  Chief Complaint  Patient presents with   Medical Management of Chronic Issues    HPI: Discussed the use of AI scribe software for clinical note transcription with the patient, who gave verbal consent to proceed.  History of Present Illness Victoria Barajas is a 78 year old female with chronic back pain who presents for a three-month follow-up.  Chronic back and tailbone pain - Persistent pain in the tailbone and back, described as constant. - Tailbone pain has worsened over time, especially when sitting. - History of tailbone fracture in early twenties. - Uses special pillows for relief; only partially effective. - Back pain intensified after rectocele surgery; pain associated with surgical site. - Avoids NSAIDs; has taken Tylenol three times since last visit with some relief. - History of crooked spine and pinched nerves as identified by back specialist.  Allergic rhinitis - Constant nasal congestion attributed to allergies. - Uses Flonase  and loratadine  as needed, especially at night when breathing becomes difficult.  Cardiac symptoms and medication management - Current medications include lisinopril  and metoprolol  for blood pressure control. - Halving metoprolol  dose due to concerns about refills and potential overuse. - History of heart irregularities resulting in emergency room visit and subsequent cardiology follow-up. - Metoprolol  prescribed following cardiac evaluation.  Urinary tract infection (resolved) - History of severe bladder infection causing significant pain. - Symptoms resolved after treatment.  General constitutional and gastrointestinal symptoms - No recent fevers, chills, sweats, earaches, sore throat, chest pain, or abdominal pain. - Trying to eat healthier; includes sourdough bread and guacamole in diet.       06/25/2024    9:59 AM 02/21/2024   10:11 AM  08/08/2023    9:29 AM 04/24/2023    3:56 PM 01/29/2023   10:51 AM  Depression screen PHQ 2/9  Decreased Interest 0 0 0 0 0  Down, Depressed, Hopeless 0 0 0 0 0  PHQ - 2 Score 0 0 0 0 0  Altered sleeping 2 2 0 1   Tired, decreased energy 2 2 0 3   Change in appetite 2 2 0 0   Feeling bad or failure about yourself  0 0 0 0   Trouble concentrating 0 0 0 0   Moving slowly or fidgety/restless 0 2 0 0   Suicidal thoughts 0 0 0 0   PHQ-9 Score 6  8  0  4    Difficult doing work/chores Somewhat difficult Not difficult at all Not difficult at all Not difficult at all      Data saved with a previous flowsheet row definition        06/25/2024    9:58 AM  Fall Risk   Falls in the past year? 0  Number falls in past yr: 0  Injury with Fall? 0   Risk for fall due to : No Fall Risks  Follow up Falls evaluation completed     Data saved with a previous flowsheet row definition    Patient Care Team: Sherre Clapper, MD as PCP - General (Family Medicine) Revankar, Jennifer SAUNDERS, MD as PCP - Cardiology (Cardiology) Delores Lauraine NOVAK, [facility] (Inactive) as Pharmacist (Pharmacist) Revankar, Jennifer SAUNDERS, MD as Consulting Physician (Cardiology) Wilma Ozell BRAVO, MD as Referring Physician (Ophthalmology)   Review of Systems  Constitutional:  Negative for chills, fatigue and fever.  HENT:  Negative for congestion, ear pain and sore  throat.   Respiratory:  Negative for cough and shortness of breath.   Cardiovascular:  Negative for chest pain.  Gastrointestinal:  Negative for abdominal pain, constipation, diarrhea, nausea and vomiting.  Genitourinary:  Negative for dysuria and urgency.  Musculoskeletal:  Positive for back pain (tail bone hurts.). Negative for arthralgias and myalgias.  Skin:  Negative for rash.  Neurological:  Negative for dizziness and headaches.  Psychiatric/Behavioral:  Negative for dysphoric mood. The patient is not nervous/anxious.     Medications Ordered Prior to Encounter[1] Past Medical  History:  Diagnosis Date   Age-related nuclear cataract of both eyes 01/06/2016   Aortic atherosclerosis    2024   BMI 36.0-36.9,adult 03/11/2020   Dyspnea on exertion 12/22/2021   Essential hypertension 11/25/2017   Gastroesophageal reflux disease without esophagitis 01/06/2016   High cholesterol    History of pancreatitis    History of shingles    Idiopathic acute pancreatitis without infection or necrosis 04/24/2023   LVH (left ventricular hypertrophy) 03/11/2020   Migraine headache 01/06/2016   Mixed hyperlipidemia 01/06/2016   Morbid obesity (HCC) 09/09/2020   Myopia of both eyes 01/06/2016   Osteoporosis 01/06/2016   Pancreatitis, unspecified pancreatitis type 04/14/2023   Plantar fasciitis, left 01/06/2016   Presbyopia of both eyes 01/06/2016   Rectocele 05/23/2016   Rupture of flexor tendon of finger 10/28/2020   Spondylosis with myelopathy, lumbar region 11/12/2019   Urgency incontinence 06/13/2021   Past Surgical History:  Procedure Laterality Date   ABDOMINAL HYSTERECTOMY     CHOLECYSTECTOMY  04/08/2023   TUBAL LIGATION     WRIST SURGERY      Family History  Problem Relation Age of Onset   Heart Problems Mother    Diabetes Father    Diabetes Sister    Kidney disease Sister    Diabetes Brother    Alzheimer's disease Brother    Heart attack Brother    Social History   Socioeconomic History   Marital status: Married    Spouse name: Not on file   Number of children: Not on file   Years of education: Not on file   Highest education level: Not on file  Occupational History   Not on file  Tobacco Use   Smoking status: Former    Current packs/day: 0.00    Types: Cigarettes    Quit date: 1993    Years since quitting: 33.0   Smokeless tobacco: Never  Substance and Sexual Activity   Alcohol use: No   Drug use: No   Sexual activity: Not Currently  Other Topics Concern   Not on file  Social History Narrative   Not on file   Social Drivers of Health    Tobacco Use: Medium Risk (07/20/2024)   Patient History    Smoking Tobacco Use: Former    Smokeless Tobacco Use: Never    Passive Exposure: Not on Actuary Strain: Low Risk (08/08/2023)   Overall Financial Resource Strain (CARDIA)    Difficulty of Paying Living Expenses: Not hard at all  Food Insecurity: No Food Insecurity (06/25/2024)   Epic    Worried About Programme Researcher, Broadcasting/film/video in the Last Year: Never true    [employer] of Food in the Last Year: Never true  Transportation Needs: No Transportation Needs (06/25/2024)   Epic    Lack of Transportation (Medical): No    Lack of Transportation (Non-Medical): No  Physical Activity: Inactive (08/08/2023)   Exercise Vital Sign    Days  of Exercise per Week: 0 days    Minutes of Exercise per Session: 0 min  Stress: No Stress Concern Present (08/08/2023)   [employer] of Occupational Health - Occupational Stress Questionnaire    Feeling of Stress : Not at all  Social Connections: Moderately Isolated (08/08/2023)   Social Connection and Isolation Panel    Frequency of Communication with Friends and Family: Three times a week    Frequency of Social Gatherings with Friends and Family: Never    Attends Religious Services: Never    Database Administrator or Organizations: No    Attends Banker Meetings: Never    Marital Status: Married  Depression (PHQ2-9): Medium Risk (06/25/2024)   Depression (PHQ2-9)    PHQ-2 Score: 6  Alcohol Screen: Low Risk (08/08/2023)   Alcohol Screen    Last Alcohol Screening Score (AUDIT): 3  Housing: Low Risk (06/25/2024)   Epic    Unable to Pay for Housing in the Last Year: No    Number of Times Moved in the Last Year: 0    Homeless in the Last Year: No  Utilities: Not At Risk (06/25/2024)   Epic    Threatened with loss of utilities: No  Health Literacy: Adequate Health Literacy (08/08/2023)   B1300 Health Literacy    Frequency of need for help with medical instructions: Never     Objective:  BP 128/84   Pulse 79   Temp 98.2 F (36.8 C)   Ht 5' 6 (1.676 m)   Wt 210 lb (95.3 kg)   SpO2 98%   BMI 33.89 kg/m      09/25/2024   10:16 AM 07/20/2024    1:18 PM 06/25/2024    9:57 AM  BP/Weight  Systolic BP 128 138 128  Diastolic BP 84 60 82  Wt. (Lbs) 210 211 211  BMI 33.89 kg/m2 34.06 kg/m2 34.06 kg/m2    Physical Exam Vitals reviewed.  Constitutional:      Appearance: Normal appearance. She is normal weight.  Neck:     Vascular: No carotid bruit.  Cardiovascular:     Rate and Rhythm: Normal rate and regular rhythm.     Heart sounds: Normal heart sounds.  Pulmonary:     Effort: Pulmonary effort is normal. No respiratory distress.     Breath sounds: Normal breath sounds.  Abdominal:     General: Abdomen is flat. Bowel sounds are normal.     Palpations: Abdomen is soft.     Tenderness: There is no abdominal tenderness.  Neurological:     Mental Status: She is alert and oriented to person, place, and time.  Psychiatric:        Mood and Affect: Mood normal.        Behavior: Behavior normal.      Diabetic foot exam was performed with the following findings:   No deformities, ulcerations, or other skin breakdown Normal sensation of 10g monofilament Intact posterior tibialis and dorsalis pedis pulses      Lab Results  Component Value Date   WBC 6.2 06/25/2024   HGB 13.8 06/25/2024   HCT 42.7 06/25/2024   PLT 233 06/25/2024   GLUCOSE 102 (H) 09/25/2024   CHOL 220 (H) 02/24/2024   TRIG 170 (H) 02/24/2024   HDL 45 02/24/2024   LDLCALC 144 (H) 02/24/2024   ALT 13 09/25/2024   AST 16 09/25/2024   NA 142 09/25/2024   K 5.2 09/25/2024   CL 105 09/25/2024   CREATININE  0.94 09/25/2024   BUN 18 09/25/2024   CO2 24 09/25/2024   TSH 1.940 04/24/2023   INR 0.95 11/08/2017   HGBA1C 6.1 09/25/2024    Results for orders placed or performed in visit on 09/25/24  POCT glycosylated hemoglobin (Hb A1C)   Collection Time: 09/25/24 10:31 AM   Result Value Ref Range   Hemoglobin A1C     HbA1c POC (<> result, manual entry) 6.1 4.0 - 5.6 %   HbA1c, POC (prediabetic range)     HbA1c, POC (controlled diabetic range)    POCT Lipid Panel   Collection Time: 09/25/24 10:31 AM  Result Value Ref Range   TC 163    HDL 49    TRG 123    LDL 90    Non-HDL 114    TC/HDL    .  Assessment & Plan:   Assessment & Plan Essential hypertension Well-controlled with lisinopril  and metoprolol . Metoprolol  also for heart disease prevention. - Continue lisinopril  and metoprolol  as prescribed. Orders:   POCT Lipid Panel   Comprehensive metabolic panel with GFR   metoprolol  succinate (TOPROL -XL) 25 MG 24 hr tablet; Take 1 tablet (25 mg total) by mouth daily.  Spondylosis with myelopathy, lumbar region Chronic pain exacerbated by sitting, worsened post-surgery. w - Recommended Tylenol 1-2 tablets twice daily as needed, max 4000 mg/day. - Advised special cushion for sitting.    Paroxysmal atrial fibrillation (HCC) Palpitations and irregular heartbeats managed with metoprolol . Reduced dose due to blood pressure concerns. No recent palpitations. Overdue cardiology follow-up. - Refilled metoprolol  25 mg daily, advised full dose. - Instructed cardiology follow-up in January or ASAP. Orders:   metoprolol  succinate (TOPROL -XL) 25 MG 24 hr tablet; Take 1 tablet (25 mg total) by mouth daily.  Hyperlipidemia associated with type 2 diabetes mellitus (HCC) Diabetes and hyperlipidemia  at goal.  Continue crestor  10 mg before bed.  Recommend continue to work on eating healthy diet and exercise.   Orders:   POCT glycosylated hemoglobin (Hb A1C)   Body mass index is 33.89 kg/m.   Meds ordered this encounter  Medications   metoprolol  succinate (TOPROL -XL) 25 MG 24 hr tablet    Sig: Take 1 tablet (25 mg total) by mouth daily.    Dispense:  30 tablet    Refill:  0    Needs follow up with cardiology scheduled. Dr. Sherre    Orders Placed This  Encounter  Procedures   Comprehensive metabolic panel with GFR   POCT glycosylated hemoglobin (Hb A1C)   POCT Lipid Panel     I,Marla I Leal-Borjas,acting as a scribe for Abigail Sherre, MD.,have documented all relevant documentation on the behalf of Abigail Sherre, MD,as directed by  Abigail Sherre, MD while in the presence of Abigail Sherre, MD.   Follow-up: Return in about 3 months (around 12/24/2024) for chronic follow up.  An After Visit Summary was printed and given to the patient.  I attest that I have reviewed this visit and agree with the plan scribed by my staff.   Abigail Sherre, MD [facility] [phone]       [1]  Current Outpatient Medications on File Prior to Visit  Medication Sig Dispense Refill   fluticasone  (FLONASE ) 50 MCG/ACT nasal spray Place 2 sprays into both nostrils daily. 16 g 11   loratadine  (CLARITIN ) 10 MG tablet Take 1 tablet (10 mg total) by mouth daily. 30 tablet 11   nitroGLYCERIN  (NITROSTAT ) 0.4 MG SL tablet Place 0.4 mg under the tongue  every 5 (five) minutes as needed for chest pain.     omeprazole (PRILOSEC) 40 MG capsule TAKE 1 CAPSULE BY MOUTH EVERY NIGHT AT BEDTIME 90 capsule 2   polyethylene glycol (MIRALAX / GLYCOLAX) packet Take 17 g by mouth daily as needed for diarrhea or loose stools.     No current facility-administered medications on file prior to visit.   "

## 2024-09-25 NOTE — Patient Instructions (Signed)
" °  VISIT SUMMARY: You had a follow-up visit to discuss your chronic back and tailbone pain, cardiac symptoms, and medication management. We also reviewed your history of allergic rhinitis and previous urinary tract infection.  YOUR PLAN: CHRONIC LOW BACK AND TAILBONE PAIN: You have persistent pain in your back and tailbone, which has worsened over time, especially when sitting. This pain intensified after your rectocele surgery. -Take Tylenol 1-2 tablets twice daily as needed, but do not exceed 4000 mg per day. -Continue using a special cushion for sitting.  CARDIAC ARRHYTHMIA: You have a history of palpitations and irregular heartbeats, which are managed with metoprolol . You have reduced your dose due to blood pressure concerns. -Refilled metoprolol  25 mg daily. Please take the full dose as prescribed. -Schedule a follow-up with your cardiologist in January or as soon as possible.  ESSENTIAL HYPERTENSION: Your blood pressure is well-controlled with lisinopril  and metoprolol . -Continue taking lisinopril  and metoprolol  as prescribed.  TYPE 2 DIABETES MELLITUS: You were previously diabetic and are now prediabetic. You are not currently monitoring your blood sugar but are trying to eat healthier. -Continue with your healthy eating habits.                      Contains text generated by Abridge.                                 Contains text generated by Abridge.   "

## 2024-09-25 NOTE — Assessment & Plan Note (Signed)
 Victoria Barajas

## 2024-09-25 NOTE — Assessment & Plan Note (Deleted)
 Previously diabetic, now prediabetic. No current blood sugar monitoring. Attempting healthier diet. - Encouraged continued healthy eating habits. Orders:   POCT glycosylated hemoglobin (Hb A1C)

## 2024-09-25 NOTE — Patient Instructions (Signed)
                         Contains text generated by Abridge.                                 Contains text generated by Abridge.

## 2024-09-25 NOTE — Assessment & Plan Note (Addendum)
 Well-controlled with lisinopril  and metoprolol . Metoprolol  also for heart disease prevention. - Continue lisinopril  and metoprolol  as prescribed. Orders:   POCT Lipid Panel   Comprehensive metabolic panel with GFR   metoprolol  succinate (TOPROL -XL) 25 MG 24 hr tablet; Take 1 tablet (25 mg total) by mouth daily.

## 2024-09-25 NOTE — Assessment & Plan Note (Signed)
  Orders:   Comprehensive metabolic panel with GFR

## 2024-09-25 NOTE — Progress Notes (Unsigned)
 "  Subjective:  Patient ID: Victoria Barajas, female    DOB: [birth year]  Age: 78 y.o. MRN: [id number]  No chief complaint on file.   HPI: Discussed the use of AI scribe software for clinical note transcription with the patient, who gave verbal consent to proceed.  History of Present Illness        06/25/2024    9:59 AM 02/21/2024   10:11 AM 08/08/2023    9:29 AM 04/24/2023    3:56 PM 01/29/2023   10:51 AM  Depression screen PHQ 2/9  Decreased Interest 0 0 0 0 0  Down, Depressed, Hopeless 0 0 0 0 0  PHQ - 2 Score 0 0 0 0 0  Altered sleeping 2 2 0 1   Tired, decreased energy 2 2 0 3   Change in appetite 2 2 0 0   Feeling bad or failure about yourself  0 0 0 0   Trouble concentrating 0 0 0 0   Moving slowly or fidgety/restless 0 2 0 0   Suicidal thoughts 0 0 0 0   PHQ-9 Score 6  8  0  4    Difficult doing work/chores Somewhat difficult Not difficult at all Not difficult at all Not difficult at all      Data saved with a previous flowsheet row definition        06/25/2024    9:58 AM  Fall Risk   Falls in the past year? 0  Number falls in past yr: 0  Injury with Fall? 0   Risk for fall due to : No Fall Risks  Follow up Falls evaluation completed     Data saved with a previous flowsheet row definition    Patient Care Team: Sherre Clapper, MD as PCP - General (Family Medicine) Revankar, Jennifer SAUNDERS, MD as PCP - Cardiology (Cardiology) Delores Lauraine NOVAK, [facility] (Inactive) as Pharmacist (Pharmacist) Revankar, Jennifer SAUNDERS, MD as Consulting Physician (Cardiology) Wilma Ozell BRAVO, MD as Referring Physician (Ophthalmology)   Review of Systems  Medications Ordered Prior to Encounter[1] Past Medical History:  Diagnosis Date   Age-related nuclear cataract of both eyes 01/06/2016   Aortic atherosclerosis    2024   BMI 36.0-36.9,adult 03/11/2020   Dyspnea on exertion 12/22/2021   Essential hypertension 11/25/2017   Gastroesophageal reflux disease without esophagitis 01/06/2016   High  cholesterol    History of pancreatitis    History of shingles    Idiopathic acute pancreatitis without infection or necrosis 04/24/2023   LVH (left ventricular hypertrophy) 03/11/2020   Migraine headache 01/06/2016   Mixed hyperlipidemia 01/06/2016   Morbid obesity (HCC) 09/09/2020   Myopia of both eyes 01/06/2016   Osteoporosis 01/06/2016   Pancreatitis, unspecified pancreatitis type 04/14/2023   Plantar fasciitis, left 01/06/2016   Presbyopia of both eyes 01/06/2016   Rectocele 05/23/2016   Rupture of flexor tendon of finger 10/28/2020   Spondylosis with myelopathy, lumbar region 11/12/2019   Urgency incontinence 06/13/2021   Past Surgical History:  Procedure Laterality Date   ABDOMINAL HYSTERECTOMY     CHOLECYSTECTOMY  04/08/2023   TUBAL LIGATION     WRIST SURGERY      Family History  Problem Relation Age of Onset   Heart Problems Mother    Diabetes Father    Diabetes Sister    Kidney disease Sister    Diabetes Brother    Alzheimer's disease Brother    Heart attack Brother    Social History   Socioeconomic History  Marital status: Married    Spouse name: Not on file   Number of children: Not on file   Years of education: Not on file   Highest education level: Not on file  Occupational History   Not on file  Tobacco Use   Smoking status: Former    Current packs/day: 0.00    Types: Cigarettes    Quit date: 46    Years since quitting: 32.9   Smokeless tobacco: Never  Substance and Sexual Activity   Alcohol use: No   Drug use: No   Sexual activity: Not Currently  Other Topics Concern   Not on file  Social History Narrative   Not on file   Social Drivers of Health   Tobacco Use: Medium Risk (07/20/2024)   Patient History    Smoking Tobacco Use: Former    Smokeless Tobacco Use: Never    Passive Exposure: Not on Actuary Strain: Low Risk (08/08/2023)   Overall Financial Resource Strain (CARDIA)    Difficulty of Paying Living  Expenses: Not hard at all  Food Insecurity: No Food Insecurity (06/25/2024)   Epic    Worried About Programme Researcher, Broadcasting/film/video in the Last Year: Never true    Ran Out of Food in the Last Year: Never true  Transportation Needs: No Transportation Needs (06/25/2024)   Epic    Lack of Transportation (Medical): No    Lack of Transportation (Non-Medical): No  Physical Activity: Inactive (08/08/2023)   Exercise Vital Sign    Days of Exercise per Week: 0 days    Minutes of Exercise per Session: 0 min  Stress: No Stress Concern Present (08/08/2023)   [employer] of Occupational Health - Occupational Stress Questionnaire    Feeling of Stress : Not at all  Social Connections: Moderately Isolated (08/08/2023)   Social Connection and Isolation Panel    Frequency of Communication with Friends and Family: Three times a week    Frequency of Social Gatherings with Friends and Family: Never    Attends Religious Services: Never    Database Administrator or Organizations: No    Attends Banker Meetings: Never    Marital Status: Married  Depression (PHQ2-9): Medium Risk (06/25/2024)   Depression (PHQ2-9)    PHQ-2 Score: 6  Alcohol Screen: Low Risk (08/08/2023)   Alcohol Screen    Last Alcohol Screening Score (AUDIT): 3  Housing: Low Risk (06/25/2024)   Epic    Unable to Pay for Housing in the Last Year: No    Number of Times Moved in the Last Year: 0    Homeless in the Last Year: No  Utilities: Not At Risk (06/25/2024)   Epic    Threatened with loss of utilities: No  Health Literacy: Adequate Health Literacy (08/08/2023)   B1300 Health Literacy    Frequency of need for help with medical instructions: Never    Objective:  Ht 5' 6 (1.676 m)   Wt 208 lb (94.3 kg)   BMI 33.57 kg/m      09/25/2024   10:16 AM 07/20/2024    1:18 PM 06/25/2024    9:57 AM  BP/Weight  Systolic BP 128 138 128  Diastolic BP 84 60 82  Wt. (Lbs) 210 211 211  BMI 33.89 kg/m2 34.06 kg/m2 34.06 kg/m2     Physical Exam  {Perform Simple Foot Exam  Perform Detailed exam:1} {Insert foot Exam (Optional):30965}   Lab Results  Component Value Date   WBC 6.2  06/25/2024   HGB 13.8 06/25/2024   HCT 42.7 06/25/2024   PLT 233 06/25/2024   GLUCOSE 99 07/13/2024   CHOL 220 (H) 02/24/2024   TRIG 170 (H) 02/24/2024   HDL 45 02/24/2024   LDLCALC 144 (H) 02/24/2024   ALT 16 07/13/2024   AST 20 07/13/2024   NA 142 07/13/2024   K 5.1 07/13/2024   CL 105 07/13/2024   CREATININE 1.01 (H) 07/13/2024   BUN 17 07/13/2024   CO2 16 (L) 07/13/2024   TSH 1.940 04/24/2023   INR 0.95 11/08/2017   HGBA1C 6.1 09/25/2024    Results for orders placed or performed in visit on 10/16/23  MYOCARDIAL PERFUSION IMAGING   Collection Time: 10/16/23 12:00 PM  Result Value Ref Range   Rest HR 60.0 bpm   Rest BP 144/97 mmHg   Peak HR 91 bpm   Peak BP 134/82 mmHg   Rest Nuclear Isotope Dose 10.8 mCi   Stress Nuclear Isotope Dose 28.6 mCi   SSS 5.0    SRS 5.0    SDS 0.0    TID 0.91    LV sys vol 52.0 mL   LV dias vol 107.0 46 - 106 mL   Nuc Stress EF 51 %  .  Assessment & Plan:   Assessment & Plan NSVT (nonsustained ventricular tachycardia) (HCC)  Orders:   MYOCARDIAL PERFUSION IMAGING   regadenoson  (LEXISCAN ) injection SOLN 0.4 mg   technetium tetrofosmin  (TC-MYOVIEW ) injection 10.8 millicurie   technetium tetrofosmin  (TC-MYOVIEW ) injection 28.6 millicurie  Essential hypertension     Mixed hyperlipidemia  Orders:   Comprehensive metabolic panel with GFR    Body mass index is 33.57 kg/m.  Assessment and Plan Assessment & Plan      Meds ordered this encounter  Medications   regadenoson  (LEXISCAN ) injection SOLN 0.4 mg   technetium tetrofosmin  (TC-MYOVIEW ) injection 10.8 millicurie   technetium tetrofosmin  (TC-MYOVIEW ) injection 28.6 millicurie    No orders of the defined types were placed in this encounter.      Follow-up: No follow-ups on file.  An After Visit  Summary was printed and given to the patient.  [facility] [facility] [phone]    [1]  Current Outpatient Medications on File Prior to Visit  Medication Sig Dispense Refill   nitroGLYCERIN  (NITROSTAT ) 0.4 MG SL tablet Place 0.4 mg under the tongue every 5 (five) minutes as needed for chest pain.     omeprazole (PRILOSEC) 40 MG capsule TAKE 1 CAPSULE BY MOUTH EVERY NIGHT AT BEDTIME 90 capsule 2   polyethylene glycol (MIRALAX / GLYCOLAX) packet Take 17 g by mouth daily as needed for diarrhea or loose stools.     No current facility-administered medications on file prior to visit.   "

## 2024-09-26 ENCOUNTER — Ambulatory Visit: Payer: Self-pay | Admitting: Family Medicine

## 2024-09-26 LAB — COMPREHENSIVE METABOLIC PANEL WITH GFR
ALT: 13 IU/L (ref 0–32)
AST: 16 IU/L (ref 0–40)
Albumin: 4.6 g/dL (ref 3.8–4.8)
Alkaline Phosphatase: 71 IU/L (ref 49–135)
BUN/Creatinine Ratio: 19 (ref 12–28)
BUN: 18 mg/dL (ref 8–27)
Bilirubin Total: 1.3 mg/dL — ABNORMAL HIGH (ref 0.0–1.2)
CO2: 24 mmol/L (ref 20–29)
Calcium: 10.5 mg/dL — ABNORMAL HIGH (ref 8.7–10.3)
Chloride: 105 mmol/L (ref 96–106)
Creatinine, Ser: 0.94 mg/dL (ref 0.57–1.00)
Globulin, Total: 1.8 g/dL (ref 1.5–4.5)
Glucose: 102 mg/dL — ABNORMAL HIGH (ref 70–99)
Potassium: 5.2 mmol/L (ref 3.5–5.2)
Sodium: 142 mmol/L (ref 134–144)
Total Protein: 6.4 g/dL (ref 6.0–8.5)
eGFR: 62 mL/min/1.73

## 2024-09-27 NOTE — Assessment & Plan Note (Addendum)
 Palpitations and irregular heartbeats managed with metoprolol . Reduced dose due to blood pressure concerns. No recent palpitations. Overdue cardiology follow-up. - Refilled metoprolol  25 mg daily, advised full dose. - Instructed cardiology follow-up in January or ASAP. Orders:   metoprolol  succinate (TOPROL -XL) 25 MG 24 hr tablet; Take 1 tablet (25 mg total) by mouth daily.

## 2024-09-27 NOTE — Assessment & Plan Note (Addendum)
 Chronic pain exacerbated by sitting, worsened post-surgery. w - Recommended Tylenol 1-2 tablets twice daily as needed, max 4000 mg/day. - Advised special cushion for sitting.

## 2024-09-30 ENCOUNTER — Other Ambulatory Visit: Payer: Self-pay | Admitting: Family Medicine

## 2024-09-30 DIAGNOSIS — I10 Essential (primary) hypertension: Secondary | ICD-10-CM

## 2024-09-30 DIAGNOSIS — E782 Mixed hyperlipidemia: Secondary | ICD-10-CM

## 2024-10-03 ENCOUNTER — Encounter: Payer: Self-pay | Admitting: Family Medicine

## 2024-10-03 NOTE — Assessment & Plan Note (Signed)
 Diabetes and hyperlipidemia  at goal.  Continue crestor  10 mg before bed.  Recommend continue to work on eating healthy diet and exercise.   Orders:   POCT glycosylated hemoglobin (Hb A1C)

## 2024-10-20 ENCOUNTER — Encounter: Payer: Self-pay | Admitting: Physician Assistant

## 2024-11-03 ENCOUNTER — Encounter: Payer: Self-pay | Admitting: Family Medicine

## 2024-11-16 ENCOUNTER — Ambulatory Visit: Admitting: Physician Assistant

## 2024-12-29 ENCOUNTER — Ambulatory Visit: Admitting: Family Medicine
